# Patient Record
Sex: Male | Born: 1950 | Race: White | Hispanic: No | Marital: Single | Smoking: Current every day smoker
Health system: Southern US, Community
[De-identification: ages and names within clinical notes are randomized; demographics above are authoritative.]

## PROBLEM LIST (undated history)

## (undated) DIAGNOSIS — I2510 Atherosclerotic heart disease of native coronary artery without angina pectoris: Secondary | ICD-10-CM

## (undated) DIAGNOSIS — Z9861 Coronary angioplasty status: Secondary | ICD-10-CM

## (undated) DIAGNOSIS — Z87891 Personal history of nicotine dependence: Secondary | ICD-10-CM

## (undated) DIAGNOSIS — I679 Cerebrovascular disease, unspecified: Secondary | ICD-10-CM

## (undated) DIAGNOSIS — K4090 Unilateral inguinal hernia, without obstruction or gangrene, not specified as recurrent: Secondary | ICD-10-CM

## (undated) DIAGNOSIS — I10 Essential (primary) hypertension: Secondary | ICD-10-CM

## (undated) DIAGNOSIS — K219 Gastro-esophageal reflux disease without esophagitis: Secondary | ICD-10-CM

## (undated) DIAGNOSIS — F1011 Alcohol abuse, in remission: Secondary | ICD-10-CM

## (undated) DIAGNOSIS — E785 Hyperlipidemia, unspecified: Secondary | ICD-10-CM

## (undated) DIAGNOSIS — R002 Palpitations: Secondary | ICD-10-CM

## (undated) DIAGNOSIS — I252 Old myocardial infarction: Secondary | ICD-10-CM

## (undated) DIAGNOSIS — R0989 Other specified symptoms and signs involving the circulatory and respiratory systems: Secondary | ICD-10-CM

## (undated) DIAGNOSIS — I739 Peripheral vascular disease, unspecified: Secondary | ICD-10-CM

## (undated) DIAGNOSIS — J449 Chronic obstructive pulmonary disease, unspecified: Secondary | ICD-10-CM

## (undated) HISTORY — DX: Coronary angioplasty status: Z98.61

## (undated) HISTORY — PX: FEMORAL-FEMORAL BYPASS GRAFT: SHX936

## (undated) HISTORY — DX: Alcohol abuse, in remission: F10.11

## (undated) HISTORY — DX: Palpitations: R00.2

## (undated) HISTORY — DX: Old myocardial infarction: I25.2

## (undated) HISTORY — DX: Peripheral vascular disease, unspecified: I73.9

## (undated) HISTORY — DX: Chronic obstructive pulmonary disease, unspecified: J44.9

## (undated) HISTORY — PX: CORONARY ANGIOPLASTY: SHX604

## (undated) HISTORY — DX: Other specified symptoms and signs involving the circulatory and respiratory systems: R09.89

## (undated) HISTORY — DX: Gastro-esophageal reflux disease without esophagitis: K21.9

## (undated) HISTORY — DX: Essential (primary) hypertension: I10

## (undated) HISTORY — DX: Hyperlipidemia, unspecified: E78.5

## (undated) HISTORY — DX: Personal history of nicotine dependence: Z87.891

## (undated) HISTORY — DX: Cerebrovascular disease, unspecified: I67.9

## (undated) HISTORY — DX: Atherosclerotic heart disease of native coronary artery without angina pectoris: I25.10

## (undated) HISTORY — DX: Unilateral inguinal hernia, without obstruction or gangrene, not specified as recurrent: K40.90

---

## 2003-04-01 ENCOUNTER — Emergency Department (HOSPITAL_COMMUNITY): Admission: EM | Admit: 2003-04-01 | Discharge: 2003-04-01 | Payer: Self-pay | Admitting: Emergency Medicine

## 2004-03-03 ENCOUNTER — Emergency Department (HOSPITAL_COMMUNITY): Admission: EM | Admit: 2004-03-03 | Discharge: 2004-03-03 | Payer: Self-pay | Admitting: Emergency Medicine

## 2005-03-15 ENCOUNTER — Emergency Department (HOSPITAL_COMMUNITY): Admission: EM | Admit: 2005-03-15 | Discharge: 2005-03-15 | Payer: Self-pay | Admitting: Emergency Medicine

## 2006-01-05 HISTORY — PX: INGUINAL HERNIA REPAIR: SUR1180

## 2006-02-01 ENCOUNTER — Emergency Department (HOSPITAL_COMMUNITY): Admission: EM | Admit: 2006-02-01 | Discharge: 2006-02-01 | Payer: Self-pay | Admitting: Emergency Medicine

## 2006-03-19 ENCOUNTER — Ambulatory Visit: Payer: Self-pay | Admitting: Cardiology

## 2006-03-24 ENCOUNTER — Ambulatory Visit: Payer: Self-pay | Admitting: Cardiology

## 2006-05-05 ENCOUNTER — Ambulatory Visit: Payer: Self-pay | Admitting: Cardiology

## 2006-05-12 ENCOUNTER — Ambulatory Visit: Payer: Self-pay | Admitting: Cardiology

## 2006-05-13 ENCOUNTER — Ambulatory Visit: Payer: Self-pay | Admitting: Cardiovascular Disease

## 2006-05-13 LAB — CONVERTED CEMR LAB
Eosinophils Absolute: 0.1 10*3/uL (ref 0.0–0.6)
Eosinophils Relative: 1.1 % (ref 0.0–5.0)
GFR calc Af Amer: 89 mL/min
GFR calc non Af Amer: 74 mL/min
Glucose, Bld: 103 mg/dL — ABNORMAL HIGH (ref 70–99)
HCT: 42.4 % (ref 39.0–52.0)
Lymphocytes Relative: 30 % (ref 12.0–46.0)
MCV: 102.7 fL — ABNORMAL HIGH (ref 78.0–100.0)
Neutro Abs: 4.4 10*3/uL (ref 1.4–7.7)
Neutrophils Relative %: 60.7 % (ref 43.0–77.0)
Platelets: 215 10*3/uL (ref 150–400)
Sodium: 136 meq/L (ref 135–145)
WBC: 7.3 10*3/uL (ref 4.5–10.5)

## 2006-05-18 ENCOUNTER — Ambulatory Visit: Payer: Self-pay | Admitting: Cardiovascular Disease

## 2006-05-18 ENCOUNTER — Ambulatory Visit (HOSPITAL_COMMUNITY): Admission: RE | Admit: 2006-05-18 | Discharge: 2006-05-18 | Payer: Self-pay | Admitting: Cardiovascular Disease

## 2006-05-27 ENCOUNTER — Encounter: Payer: Self-pay | Admitting: Cardiology

## 2006-07-05 ENCOUNTER — Ambulatory Visit: Payer: Self-pay

## 2006-07-12 ENCOUNTER — Ambulatory Visit: Payer: Self-pay | Admitting: Cardiovascular Disease

## 2006-07-12 LAB — CONVERTED CEMR LAB
BUN: 12 mg/dL (ref 6–23)
Calcium: 9.6 mg/dL (ref 8.4–10.5)
Eosinophils Absolute: 0.1 10*3/uL (ref 0.0–0.6)
GFR calc Af Amer: 113 mL/min
GFR calc non Af Amer: 93 mL/min
Glucose, Bld: 119 mg/dL — ABNORMAL HIGH (ref 70–99)
Lymphocytes Relative: 20.3 % (ref 12.0–46.0)
MCHC: 34.4 g/dL (ref 30.0–36.0)
MCV: 102.9 fL — ABNORMAL HIGH (ref 78.0–100.0)
Monocytes Relative: 7 % (ref 3.0–11.0)
Neutro Abs: 4.8 10*3/uL (ref 1.4–7.7)
Platelets: 258 10*3/uL (ref 150–400)

## 2006-07-16 ENCOUNTER — Ambulatory Visit (HOSPITAL_COMMUNITY): Admission: RE | Admit: 2006-07-16 | Discharge: 2006-07-16 | Payer: Self-pay | Admitting: Cardiovascular Disease

## 2006-07-22 ENCOUNTER — Ambulatory Visit: Payer: Self-pay | Admitting: Cardiovascular Disease

## 2008-09-25 DIAGNOSIS — I259 Chronic ischemic heart disease, unspecified: Secondary | ICD-10-CM

## 2008-09-25 DIAGNOSIS — Z8679 Personal history of other diseases of the circulatory system: Secondary | ICD-10-CM | POA: Insufficient documentation

## 2008-09-25 DIAGNOSIS — I10 Essential (primary) hypertension: Secondary | ICD-10-CM | POA: Insufficient documentation

## 2008-09-25 DIAGNOSIS — K219 Gastro-esophageal reflux disease without esophagitis: Secondary | ICD-10-CM

## 2008-09-25 DIAGNOSIS — I739 Peripheral vascular disease, unspecified: Secondary | ICD-10-CM

## 2008-09-25 DIAGNOSIS — Z87898 Personal history of other specified conditions: Secondary | ICD-10-CM

## 2008-09-25 DIAGNOSIS — Z9861 Coronary angioplasty status: Secondary | ICD-10-CM

## 2008-09-25 DIAGNOSIS — Z87891 Personal history of nicotine dependence: Secondary | ICD-10-CM

## 2008-09-25 DIAGNOSIS — E785 Hyperlipidemia, unspecified: Secondary | ICD-10-CM

## 2008-11-16 ENCOUNTER — Encounter: Payer: Self-pay | Admitting: Cardiology

## 2009-02-19 ENCOUNTER — Ambulatory Visit: Payer: Self-pay | Admitting: Cardiology

## 2009-02-19 ENCOUNTER — Encounter: Payer: Self-pay | Admitting: Cardiology

## 2009-02-20 ENCOUNTER — Encounter: Payer: Self-pay | Admitting: Cardiology

## 2009-02-21 ENCOUNTER — Encounter: Payer: Self-pay | Admitting: Cardiology

## 2009-02-26 ENCOUNTER — Ambulatory Visit: Payer: Self-pay | Admitting: Cardiology

## 2009-02-26 DIAGNOSIS — I7389 Other specified peripheral vascular diseases: Secondary | ICD-10-CM

## 2009-02-26 DIAGNOSIS — R9430 Abnormal result of cardiovascular function study, unspecified: Secondary | ICD-10-CM | POA: Insufficient documentation

## 2009-03-04 ENCOUNTER — Encounter: Payer: Self-pay | Admitting: Cardiology

## 2009-03-15 ENCOUNTER — Ambulatory Visit: Payer: Self-pay | Admitting: Cardiovascular Disease

## 2009-03-18 ENCOUNTER — Encounter: Payer: Self-pay | Admitting: Cardiovascular Disease

## 2009-03-20 ENCOUNTER — Ambulatory Visit: Payer: Self-pay | Admitting: Cardiovascular Disease

## 2009-03-20 ENCOUNTER — Ambulatory Visit (HOSPITAL_COMMUNITY): Admission: RE | Admit: 2009-03-20 | Discharge: 2009-03-20 | Payer: Self-pay | Admitting: Cardiovascular Disease

## 2009-03-25 ENCOUNTER — Encounter: Payer: Self-pay | Admitting: Cardiology

## 2009-03-29 ENCOUNTER — Ambulatory Visit: Payer: Self-pay | Admitting: Vascular Surgery

## 2009-04-03 ENCOUNTER — Encounter: Payer: Self-pay | Admitting: Cardiology

## 2009-04-04 ENCOUNTER — Inpatient Hospital Stay (HOSPITAL_COMMUNITY): Admission: RE | Admit: 2009-04-04 | Discharge: 2009-04-06 | Payer: Self-pay | Admitting: Vascular Surgery

## 2009-04-04 ENCOUNTER — Encounter: Payer: Self-pay | Admitting: Cardiology

## 2009-04-04 ENCOUNTER — Ambulatory Visit: Payer: Self-pay | Admitting: Vascular Surgery

## 2009-04-05 ENCOUNTER — Encounter: Payer: Self-pay | Admitting: Cardiology

## 2009-04-05 ENCOUNTER — Encounter: Payer: Self-pay | Admitting: Vascular Surgery

## 2009-04-06 ENCOUNTER — Encounter: Payer: Self-pay | Admitting: Cardiology

## 2009-04-25 ENCOUNTER — Encounter: Payer: Self-pay | Admitting: Cardiology

## 2009-07-19 ENCOUNTER — Telehealth (INDEPENDENT_AMBULATORY_CARE_PROVIDER_SITE_OTHER): Payer: Self-pay | Admitting: *Deleted

## 2009-08-14 ENCOUNTER — Encounter: Payer: Self-pay | Admitting: Cardiology

## 2009-08-28 ENCOUNTER — Ambulatory Visit: Payer: Self-pay | Admitting: Cardiology

## 2009-11-13 ENCOUNTER — Encounter: Payer: Self-pay | Admitting: Cardiology

## 2009-12-16 ENCOUNTER — Encounter: Payer: Self-pay | Admitting: Cardiology

## 2010-01-15 ENCOUNTER — Ambulatory Visit: Admit: 2010-01-15 | Payer: Self-pay | Admitting: Vascular Surgery

## 2010-01-23 ENCOUNTER — Ambulatory Visit: Admit: 2010-01-23 | Payer: Self-pay | Admitting: Vascular Surgery

## 2010-02-06 NOTE — Assessment & Plan Note (Signed)
Summary: eph-possible cath needed per degent   Visit Type:  hospital follow-up Primary Provider:  Dr. Sharlot Gowda Napier,MD  CC:  discuss abnormal stress test.  History of Present Illness: the patient is a 60 year old male with a history of coronary artery disease, severe peripheral vascular disease and nonobstructive cerebrovascular disease. The patient was recently hospitalized with atypical chest pain. A nuclear perfusion study was performed. Ejection fraction was 57%. There was a small reversible apical to mid anterior defect associated with normal wall motion. There appears some ischemia in this area. Overall the study was a low risk study. The patient presents now for followup. He reports no recurrent substernal chest pain. He does have dyspnea on exertion but it is hard to quantify due to his severe peripheral vascular disease. The patient has significant claudication, developing pain in the lower extremities walking less than 50 feet particularly in the right leg. He even reports that during the nighttime he has pain in his right leg when he lies on his right side. He is very uncomfortable and has to move side to side he only sleeps 3-4 hours a night.  The patient has known severe peripheral vascular disease and is status post stent placement in both iliac arteries. He has a known occlusion of the right iliac stent. The patient was supposed to follow up with Dr. Excell Seltzer in 2008 after his angiogram was performed., but failed to do so due to lack of insurance. recommendation was given for the patient to proceed with surgery.  From a cardiac standpoint he is stable. As outlined above he has no recurrent chest pain. He has not used any nitroglycerin. His blood pressure however is poorly controlled.  Clinical Review Panels:  Nuclear Stress Testing Nuclear Stress Test Findings abnormal LV perfusion. Stress testing induced no chest pain symptoms and no EKG changes consistent with ischemia. This was a  LexiScan there was increased t.i.d. ratio. Ejection fraction was 57%. There was a small completely reversible apical to mid anterior defect associated with normal wall motion. This defect is consistent with ischemia. There was also second medium fixed mid to basal inferior defect associated with normal wall motion this defect is consistent with attenuation artifact (02/21/2009)  Vascular Studies Arterial Doppler  Arterial Doppler study from July 05, 2006 demonstrated no flow within the right external iliac stent. The common femoral artery fills from a collateral, as well as from retrograde flow from the profunda. The anterior tibial and posterior tibial wave forms on the right arm monophasic. On the left side, the anterior tibial and posterior tibial wave forms are brisk and biphasic.  (07/12/2006)  Cardiac Imaging Cardiac Cath Findings  FINDINGS:  The distal aorta is angiographically normal.  The left iliac   artery, the left common and external iliac arteries are angiographically   normal.  The left common femoral artery tapers into a smaller vessel,   but there is no focal stenosis.      On the right, the common iliac artery is normal.  The right hypogastric   artery is severely diseased, but is patent.  Just at the origin of the   stent in the external iliac artery there is a flush occlusion.  The   entire 6 cm of stented segment are occluded and flow reconstitutes in   the common femoral artery via collaterals from the hypogastric.  There   are minor luminal irregularities in the SFA throughout.  The popliteal   has minor luminal irregularities.  There is three-vessel  runoff to the   foot without any significant stenoses.  There is slow flow in   infragenicular vessels.  The right profunda femoris is patent.      ASSESSMENT:   1. 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   2. Three-vessel runoff to the foot.      I reviewed the  films and I am not very hopeful of long-term patency via   an endovascular approach.  In addition, the occlusion appears to extend   down into the common femoral artery, which limits endovascular options.   I reviewed   the study with Dr. Arbie Cookey, who agreed that surgical treatment would   provide the best long-term result.  He would likely be a good candidate   for a left-to-right fem-fem bypass.  We will make arrangements for Mr.   Trudel to see Dr. Arbie Cookey in the office.  I will discontinue his Plavix and   continue him on aspirin alone.               Veverly Fells. Excell Seltzer, MD   Electronically Signed  (07/16/2006)    Preventive Screening-Counseling & Management  Alcohol-Tobacco     Smoking Status: current     Smoking Cessation Counseling: yes     Packs/Day: 1/2 PPD  Current Problems (verified): 1)  Percutaneous Transluminal Coronary Angioplasty, Hx of  (ICD-V45.82) 2)  Right Inguinal Hernia Repair  () 3)  Coronary Artery Disease, S/p Ptca  (ICD-414.9) 4)  Palpitations, Hx of  (ICD-V12.50) 5)  Gastroesophageal Reflux Disease  (ICD-530.81) 6)  Inguinal Hernia, Right, Hx of  (ICD-V13.8) 7)  Dyslipidemia  (ICD-272.4) 8)  Tobacco Abuse, Hx of  (ICD-V15.82) 9)  Hypertension  (ICD-401.9) 10)  Myocardial Infarction, Hx of  (ICD-410.90) 11)  Intermittent Claudication, Right Leg  (ICD-443.9)  Current Medications (verified): 1)  Aspir-Low 81 Mg Tbec (Aspirin) .... Take 1 Tablet By Mouth Once A Day 2)  Combivent 18-103 Mcg/act Aero (Ipratropium-Albuterol) .... Inhal Two Puffs Four Times A Day 3)  Nitrostat 0.4 Mg Subl (Nitroglycerin) .... As Needed 4)  Amlodipine Besylate 10 Mg Tabs (Amlodipine Besylate) .... Take 1 Tablet By Mouth Once A Day (Place On File) 5)  Isosorbide Mononitrate Cr 30 Mg Xr24h-Tab (Isosorbide Mononitrate) .... Take 1 Tablet By Mouth Once A Day 6)  Avelox 400 Mg Tabs (Moxifloxacin Hcl) .... Take 1 Tablet By Mouth Once A Day 7)  Simvastatin 20 Mg Tabs (Simvastatin) ....  Take 1 Tab By Mouth At Bedtime  Allergies (verified): No Known Drug Allergies  Comments:  Nurse/Medical Assistant: The patient's medications and allergies were reviewed with the patient and were updated in the Medication and Allergy Lists. Bottles reviewed.  Past History:  Past Medical History: Last updated: 02/26/2009 Current Problems:  CORONARY ARTERY DISEASE, S/P PTCA (ICD-414.9) PALPITATIONS, HX OF (ICD-V12.50) GASTROESOPHAGEAL REFLUX DISEASE INGUINAL HERNIA, RIGHT, HX OF (ICD-V13.8) DYSLIPIDEMIA (ICD-272.4) TOBACCO ABUSE, HX OF (ICD-V15.82) HYPERTENSION (ICD-401.9) MYOCARDIAL INFARCTION, HX OF (ICD-410.90) age 8 again age 75 INTERMITTENT CLAUDICATION, RIGHT LEG (ICD-443.9)  1. Coronary artery disease.     a.     History of myocardial infarction.     b.     Status post percutaneous coronary intervention in the past,      details unavailable. 2. Uncontrolled hypertension. 3. Dyslipidemia, untreated. 4. Peripheral arterial disease with abnormal ABIs (0.58 on the right,     0.93 on the left). 5. Cerebral vascular  disease with less than 50% bilateral internal     carotid artery stenosis by recent carotid Dopplers. 6. Abdominal bruit. 7. Recent history of right inguinal hernia repair. 8. Gastroesophageal reflux disease. 9. Palpitations. 10.Ongoing tobacco abuse. 11.History of alcohol abuse.   Past Surgical History: Last updated: 09/25/2008 Current Problems:  PERCUTANEOUS TRANSLUMINAL CORONARY ANGIOPLASTY, HX OF (ICD-V45.82) times 3 * RIGHT INGUINAL HERNIA REPAIR 2008  Family History: Last updated: 09/25/2008 Family History of Cancer:  no family history of peripheral artery disease.  Social History: Last updated: 09/25/2008 Divorced 3 children Tobacco Use - Yes.  Alcohol Use - yes  Risk Factors: Smoking Status: current (02/26/2009) Packs/Day: 1/2 PPD (02/26/2009)  Social History: Packs/Day:  1/2 PPD  Review of Systems       The patient complains of  fatigue and shortness of breath.  The patient denies malaise, fever, weight gain/loss, vision loss, decreased hearing, hoarseness, chest pain, palpitations, prolonged cough, wheezing, sleep apnea, coughing up blood, abdominal pain, blood in stool, nausea, vomiting, diarrhea, heartburn, incontinence, blood in urine, muscle weakness, joint pain, leg swelling, rash, skin lesions, headache, fainting, dizziness, depression, anxiety, enlarged lymph nodes, easy bruising or bleeding, and environmental allergies.         claudication  Vital Signs:  Patient profile:   60 year old male Height:      68 inches Weight:      149 pounds BMI:     22.74 Pulse rate:   81 / minute BP sitting:   165 / 79  (left arm) Cuff size:   regular  Vitals Entered By: Carlye Grippe (February 26, 2009 1:02 PM)  Serial Vital Signs/Assessments:  Time      Position  BP       Pulse  Resp  Temp     By 1:54 PM             160/85   73                    Hoover Brunette, LPN  CC: discuss abnormal stress test   Physical Exam  Additional Exam:  General: Well-developed, well-nourished in no distress head: Normocephalic and atraumatic eyes PERRLA/EOMI intact, conjunctiva and lids normal nose: No deformity or lesions mouth normal dentition, normal posterior pharynx neck: Supple, no JVD.  No masses, thyromegaly or abnormal cervical nodes lungs: Normal breath sounds bilaterally with faint wheezes..  Normal percussion heart: regular rate and rhythm with normal S1 and S2, no S3 or S4.  PMI is normal.  No pathological murmurs abdomen: Normal bowel sounds, abdomen is soft and nontender without masses, organomegaly or hernias noted.  No hepatosplenomegaly musculoskeletal: Back normal, normal gait muscle strength and tone normal pulsus:unable to palpate pulses in the right lower extremity both her son is present posterior tibial. Faint palpable pulses in the left her cells present posterior tibial. Extremities: No peripheral pitting  edema neurologic: Alert and oriented x 3 skin: Intact without lesions or rashes cervical nodes: No significant adenopathy psychologic: Normal affect    Impression & Recommendations:  Problem # 1:  NONSPECIFIC ABNORMAL UNSPEC CV FUNCTION STUDY (ICD-794.30) the patient was recently hospitalized with atypical chest pain. The patient now reports no recurrent pain. He was ruled out for myocardial infarction. Stress Cardiolite studies showed a small area of anterior ischemia. However is felt at this point the patient can be treated medically.  Problem # 2:  PVD WITH CLAUDICATION (ICD-443.89) the patient is status post prior PTCA stent placement of both  iliac arteries. He has a known 100% occlusion of the right iliac artery after an angiogram in 2008. The patient now has resting pain in the right leg as well as severe claudication particularly in the right leg but also in the left leg on minimal exertion. Previously was not felt that an intervention could be done percutaneously. Dr. Excell Seltzer referred patient to Dr. Karlene Lineman for surgery, however the patient had no insurance at the time and did not proceed with the appointment. He is now willing to proceed. Has obtained Medicaid. I will refer to patient first to Dr. Excell Seltzer given the fact that he has claudication in the left lower extremity and may need a repeat angiogram prior to proceeding with surgery.  Problem # 3:  CORONARY ARTERY DISEASE, S/P PTCA (ICD-414.9) the patient does not recall a prior coronary intervention. I could not find any records regarding this. We will continue with aggressive risk factor modification. The patient continues to smoke and I counseled him extensively. I also placed him on statin drug therapy with simvastatin 20 milligramp.o. q.h.s. The following medications were removed from the medication list:    Bisoprolol-hydrochlorothiazide 5-6.25 Mg Tabs (Bisoprolol-hydrochlorothiazide) .Marland Kitchen... Take 1 tablet by mouth once a day     Lisinopril 40 Mg Tabs (Lisinopril) .Marland Kitchen... Take 1 tablet by mouth once a day    Plavix 75 Mg Tabs (Clopidogrel bisulfate) .Marland Kitchen... Take 1 tablet by mouth once a day His updated medication list for this problem includes:    Aspir-low 81 Mg Tbec (Aspirin) .Marland Kitchen... Take 1 tablet by mouth once a day    Nitrostat 0.4 Mg Subl (Nitroglycerin) .Marland Kitchen... As needed    Amlodipine Besylate 10 Mg Tabs (Amlodipine besylate) .Marland Kitchen... Take 1 tablet by mouth once a day (place on file)    Isosorbide Mononitrate Cr 30 Mg Xr24h-tab (Isosorbide mononitrate) .Marland Kitchen... Take 1 tablet by mouth once a day  Problem # 4:  HYPERTENSION (ICD-401.9) the patient blood pressure is poorly controlled and I increased amlodipine to 10 mg p.o. q. daily. The following medications were removed from the medication list:    Bisoprolol-hydrochlorothiazide 5-6.25 Mg Tabs (Bisoprolol-hydrochlorothiazide) .Marland Kitchen... Take 1 tablet by mouth once a day    Lisinopril 40 Mg Tabs (Lisinopril) .Marland Kitchen... Take 1 tablet by mouth once a day His updated medication list for this problem includes:    Aspir-low 81 Mg Tbec (Aspirin) .Marland Kitchen... Take 1 tablet by mouth once a day    Amlodipine Besylate 10 Mg Tabs (Amlodipine besylate) .Marland Kitchen... Take 1 tablet by mouth once a day (place on file)  Patient Instructions: 1)  Increase Amlodipine to 10mg  daily 2)  Referral to Dr. Excell Seltzer for claudication 3)  Simvastatin 20mg  at bedtime  4)  Labs with Dr. Barbara Cower soon - CMET, Lipids, LFT's  (please send copy) 5)  Follow up in  6 months.    Prescriptions: SIMVASTATIN 20 MG TABS (SIMVASTATIN) Take 1 tab by mouth at bedtime  #30 x 6   Entered by:   Hoover Brunette, LPN   Authorized by:   Lewayne Bunting, MD, [facility]   Signed by:   Hoover Brunette, LPN on 16/10/9602   Method used:   Electronically to        [facility]* (retail)       [address]       [place], [place]  [address]       Ph: [phone]       Fax: [phone]  RxID:   [id number] AMLODIPINE BESYLATE 10 MG  TABS (AMLODIPINE BESYLATE) Take 1 tablet by mouth once a day (place on file)  #30 x 6   Entered by:   Hoover Brunette, LPN   Authorized by:   Lewayne Bunting, MD, [facility]   Signed by:   Hoover Brunette, LPN on 24/40/1027   Method used:   Electronically to        [facility] (retail)       [address] [place], [place]  [address]       Ph: [phone]       Fax: [phone]   RxID:   [id number]

## 2010-02-06 NOTE — Assessment & Plan Note (Signed)
Summary: ec6   Visit Type:  Initial Consult Primary Provider:  Dr. Sharlot Gowda Napier,MD  CC:  New PV evaluation per Dr. Andee Lineman- Claudication.  History of Present Illness: the patient is a 60 year old male with a history of coronary artery disease, severe peripheral vascular disease and nonobstructive cerebrovascular disease. He underwent right iliac stenting in 2008 for treatment of severe intermittent claudication. He followed up with recurrent leg pain and was found to have iliac stent occlusion on repeat angiography. I referred him to Dr Early for consideration of fem-fem bypass, but he was lost to follow-up because of limited resources.  He was recently seen by Dr Andee Lineman and referred back for follow-up evaluation. He reports progressive right leg claudication symptoms and rest pain. Pain is in hip, buttock, and is worst in the right calf. He also has left calf pain with ambulation, but not as bad as the right. No ulceration reported. He also c/o numbness and tingling of both feet, worse on the right.   The pt was recently hospitalized for chest pain. He underwent a Myoview scan showing anteroapical ischemia, but the defect was small so he was managed medically. He has known CAD with prior diagonal branch PTCA in 1993. Cath at that time showed moderate nonobstructive disease of the LCx as well.  Current Medications (verified): 1)  Aspir-Low 81 Mg Tbec (Aspirin) .... Take 1 Tablet By Mouth Once A Day 2)  Combivent 18-103 Mcg/act Aero (Ipratropium-Albuterol) .... Inhal Two Puffs Four Times A Day 3)  Nitrostat 0.4 Mg Subl (Nitroglycerin) .... As Needed 4)  Amlodipine Besylate 10 Mg Tabs (Amlodipine Besylate) .... Take 1 Tablet By Mouth Once A Day (Place On File) 5)  Isosorbide Mononitrate Cr 30 Mg Xr24h-Tab (Isosorbide Mononitrate) .... Take 1 Tablet By Mouth Once A Day 6)  Simvastatin 20 Mg Tabs (Simvastatin) .... Take 1 Tab By Mouth At Bedtime 7)  Lisinopril 20 Mg Tabs (Lisinopril) .... Take One  Tablet By Mouth Daily  Allergies (verified): No Known Drug Allergies  Past History:  Past medical history reviewed for relevance to current acute and chronic problems.  Past Medical History: Reviewed history from 02/26/2009 and no changes required. Current Problems:  CORONARY ARTERY DISEASE, S/P PTCA (ICD-414.9) PALPITATIONS, HX OF (ICD-V12.50) GASTROESOPHAGEAL REFLUX DISEASE INGUINAL HERNIA, RIGHT, HX OF (ICD-V13.8) DYSLIPIDEMIA (ICD-272.4) TOBACCO ABUSE, HX OF (ICD-V15.82) HYPERTENSION (ICD-401.9) MYOCARDIAL INFARCTION, HX OF (ICD-410.90) age 27 again age 23 INTERMITTENT CLAUDICATION, RIGHT LEG (ICD-443.9)  1. Coronary artery disease.     a.     History of myocardial infarction.     b.     Status post percutaneous coronary intervention in the past,      details unavailable. 2. Uncontrolled hypertension. 3. Dyslipidemia, untreated. 4. Peripheral arterial disease with abnormal ABIs (0.58 on the right,     0.93 on the left). 5. Cerebral vascular disease with less than 50% bilateral internal     carotid artery stenosis by recent carotid Dopplers. 6. Abdominal bruit. 7. Recent history of right inguinal hernia repair. 8. Gastroesophageal reflux disease. 9. Palpitations. 10.Ongoing tobacco abuse. 11.History of alcohol abuse.   Review of Systems       Positive for exertional dyspnea and cough, otherwise negative except as per HPI.  Vital Signs:  Patient profile:   60 year old male Height:      68 inches Weight:      147.75 pounds BMI:     22.55 Pulse rate:   78 / minute Pulse rhythm:   regular Resp:  18 per minute BP sitting:   162 / 76  (left arm) Cuff size:   large  Vitals Entered By: Vikki Ports (March 15, 2009 4:06 PM)  Serial Vital Signs/Assessments:  Time      Position  BP       Pulse  Resp  Temp     By           R Arm     153/76                         Vikki Ports   Physical Exam  General:  Pt is alert and oriented, thin male, in no acute  distress. HEENT: normal Neck: normal carotid upstrokes with bilateral bruits, JVP normal Lungs: CTA CV: RRR with 2/6 systolic ejection murmur at LSB Abd: soft, NT, positive BS, no bruit, no organomegaly Ext: trace pedal edema bilaterally. Right leg pulses absent. Left femoral 2+, but pedals absent. Skin: dependent rubor right worse than left, warm feet.    Impression & Recommendations:  Problem # 1:  PVD WITH CLAUDICATION (ICD-443.89) The patient has progressive claudication symptoms and can only walk short distances. He has prolonged pain after a very short walking distance as well as nocturnal leg pain. He is developing chronic ischemic symptoms and I have recommended repeat angiography to define his revascularization options. Risks, indications, and alternatives were reviewed in detail and he agrees to proceed. He likely will require surgical revascularization since his external iliac is occluded into his common femoral artery. He should continue wiht antiplatelet Rx and risk reduction measures as he is doing. The importance of tobacco cessation was reviewed with him.  Problem # 2:  CORONARY ARTERY DISEASE, S/P PTCA (ICD-414.9) With recent episode of chest pain, abnormal Myoview, and known CAD, I would favor cardiac cath at the time of his lower extremity angiogram. He likely will require major vascular surgery and we need to make sure that he doesn't have critical CAD. I reviewed his prior cath report and he had moderate CAD nearly 20 years ago, so the likelihood of severe CAD is very high.  His updated medication list for this problem includes:    Aspir-low 81 Mg Tbec (Aspirin) .Marland Kitchen... Take 1 tablet by mouth once a day    Nitrostat 0.4 Mg Subl (Nitroglycerin) .Marland Kitchen... As needed    Amlodipine Besylate 10 Mg Tabs (Amlodipine besylate) .Marland Kitchen... Take 1 tablet by mouth once a day (place on file)    Isosorbide Mononitrate Cr 30 Mg Xr24h-tab (Isosorbide mononitrate) .Marland Kitchen... Take 1 tablet by mouth once a  day    Lisinopril 20 Mg Tabs (Lisinopril) .Marland Kitchen... Take one tablet by mouth daily  Orders: Cardiac Catheterization (Cardiac Cath) PV Procedure (PV Procedure)  Patient Instructions: 1)  Your physician has requested that you have a cardiac catheterization.  Cardiac catheterization is used to diagnose and/or treat various heart conditions. Doctors may recommend this procedure for a number of different reasons. The most common reason is to evaluate chest pain. Chest pain can be a symptom of coronary artery disease (CAD), and cardiac catheterization can show whether plaque is narrowing or blocking your heart's arteries. This procedure is also used to evaluate the valves, as well as measure the blood flow and oxygen levels in different parts of your heart.  For further information please visit [email or web address]  Please follow instruction sheet, as given. 2)  Your physician has requested that you have a peripheral vascular angiogram. This exam is performed  at the hospital. During this exam IV contrast is used to look at arterial blood flow.  Please review the information sheet given for details.

## 2010-02-06 NOTE — Letter (Signed)
Summary: [facility] D/C DR. Wende Crease  [facility] D/C DR. Wende Crease   Imported By: Zachary George 02/26/2009 10:07:26  _____________________________________________________________________  External Attachment:    Type:   Image     Comment:   External Document

## 2010-02-06 NOTE — Letter (Signed)
Summary: Cardiac Catheterization Instructions- Main Lab  [facility], Main Office  [address]   [place], [place] [address]   Phone: [phone]  Fax: [phone]     03/15/2009 MRN: [id number]  Ralph [name] [address] [place], [place]  [address]  Dear Mr. Ralph [name],   You are scheduled for Cardiac Catheterization and Peripheral Vascular Angiogram on Wednesday March 20, 2009 with Dr. Excell Seltzer.  Please arrive at the [facility] at 7:00      a.m. on the day of your procedure.  1. DIET     _X___ Nothing to eat or drink after midnight except your medications with a sip of water.  2. MAKE SURE YOU TAKE YOUR ASPIRIN.  3. _X___ YOU MAY TAKE ALL of your remaining medications with a small amount of water.       4. Plan for one night stay - bring personal belongings (i.e. toothpaste, toothbrush, etc.)  5. Bring a current list of your medications and current insurance cards.  6. Must have a responsible person to drive you home.   7. Someone must be with you for the first 24 hours after you arrive home.  8. Please wear clothes that are easy to get on and off and wear slip-on shoes.  *Special note: Every effort is made to have your procedure done on time.  Occasionally there are emergencies that present themselves at the hospital that may cause delays.  Please be patient if a delay does occur.  If you have any questions after you get home, please call the office at the number listed above.  Julieta Gutting, RN, BSN

## 2010-02-06 NOTE — Progress Notes (Signed)
Summary: chest pain      Phone Note Call from Patient   Summary of Call: c/o chest pains off/on x last 3-4 months.  Occurs mostly with exertion.  SOB seems to be more now.  States that pain has not been bad enough to go to ED for evaluation.  Does have OV in August with you.  Advised to go to ED if symptoms worsen.  Hoover Brunette, LPN  July 19, 2009 11:48 AM   Follow-up for Phone Call        Agree Follow-up by: Lewayne Bunting, MD, [facility],  July 19, 2009 1:10 PM  Additional Follow-up for Phone Call Additional follow up Details #1::        Left messaage with relative at home.  States he was not there now.  Advised him to let him know to go to ED for evaluation if chest pain worsens over weekend.  He can return call on Monday.  Hoover Brunette, LPN  July 19, 2009 5:02 PM

## 2010-02-06 NOTE — Assessment & Plan Note (Signed)
Summary: 6 MO FU PER AUG REMINDER-SRS   Visit Type:  Follow-up Primary Provider:  Dr. Sharlot Gowda Napier,MD   History of Present Illness: the patient is a 60 year old male history of coronary artery disease status post prior percutaneous coronary intervention. The patient also has right carotid artery disease. The patient also has a history of peripheral vascular disease with right leg claudication with right external iliac artery occlusion. The patient underwent a left to right femoral-femoral bypass several months ago. He has been doing well. He is now able to walk longer distances and reports no pain in his lower extremities.  He reports no substernal chest pain. He has no shortness of breath. He has no orthopnea PND. He has no palpitations or syncope. His LDL is 59 milligram percent and at goal. The patient is doing quite well.  Preventive Screening-Counseling & Management  Alcohol-Tobacco     Smoking Status: current     Smoking Cessation Counseling: yes     Packs/Day: 1/2 PPD  Current Medications (verified): 1)  Aspir-Low 81 Mg Tbec (Aspirin) .... Take 1 Tablet By Mouth Once A Day 2)  Combivent 18-103 Mcg/act Aero (Ipratropium-Albuterol) .... Inhal Two Puffs Four Times A Day 3)  Nitrostat 0.4 Mg Subl (Nitroglycerin) .... As Needed 4)  Amlodipine Besylate 10 Mg Tabs (Amlodipine Besylate) .... Take 1 Tablet By Mouth Once A Day (Place On File) 5)  Isosorbide Mononitrate Cr 30 Mg Xr24h-Tab (Isosorbide Mononitrate) .... Take 1 Tablet By Mouth Once A Day 6)  Simvastatin 20 Mg Tabs (Simvastatin) .... Take 1 Tab By Mouth At Bedtime 7)  Lisinopril 20 Mg Tabs (Lisinopril) .... Take One Tablet By Mouth Daily 8)  Omeprazole 20 Mg Cpdr (Omeprazole) .... Take 1 Tablet By Mouth Once A Day  Allergies (verified): No Known Drug Allergies  Comments:  Nurse/Medical Assistant: The patient's medication bottles and allergies were reviewed with the patient and were updated in the Medication and Allergy  Lists.  Past History:  Past Medical History: Current Problems:  CORONARY ARTERY DISEASE, S/P PTCA (ICD-414.9) status post cardiac catheterization March 2011 moderate diffuse three-vessel coronary artery disease with normal LV function. 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 COPD  1. Coronary artery disease.     a.     History of myocardial infarction.     b.     Status post percutaneous coronary intervention in the past,      details unavailable. 2. Uncontrolled hypertension. 3. Dyslipidemia, untreated. 4. Peripheral arterial disease with abnormal ABIs (0.58 on the right,     0.93 on the left). 5. Cerebral vascular disease with less than 50% bilateral internal     carotid artery stenosis by recent carotid Dopplers. 6. Abdominal bruit. 7. Recent history of right inguinal hernia repair. 8. Gastroesophageal reflux disease. 9. Palpitations. 10.Ongoing tobacco abuse. 11.History of alcohol abuse.   Review of Systems  The patient denies fatigue, malaise, fever, weight gain/loss, vision loss, decreased hearing, hoarseness, chest pain, palpitations, shortness of breath, prolonged cough, wheezing, sleep apnea, coughing up blood, abdominal pain, blood in stool, nausea, vomiting, diarrhea, heartburn, incontinence, blood in urine, muscle weakness, joint pain, leg swelling, rash, skin lesions, headache, fainting, dizziness, depression, anxiety, enlarged lymph nodes, easy bruising or bleeding, and environmental allergies.  Vital Signs:  Patient profile:   60 year old male Height:      68 inches Weight:      142 pounds Pulse rate:    71 / minute BP sitting:   143 / 72  (left arm) Cuff size:   regular  Vitals Entered By: Carlye Grippe (August 28, 2009 10:44 AM)  Physical Exam  Additional Exam:  General: Well-developed, well-nourished in no distress head: Normocephalic and atraumatic eyes PERRLA/EOMI intact, conjunctiva and lids normal nose: No deformity or lesions mouth normal dentition, normal posterior pharynx neck: Supple, no JVD.  No masses, thyromegaly or abnormal cervical nodes lungs: Normal breath sounds bilaterally with faint wheezes..  Normal percussion heart: regular rate and rhythm with normal S1 and S2, no S3 or S4.  PMI is normal.  No pathological murmurs abdomen: Normal bowel sounds, abdomen is soft and nontender without masses, organomegaly or hernias noted.  No hepatosplenomegaly musculoskeletal: Back normal, normal gait muscle strength and tone normal pulsus:unable to palpate pulses in the right lower extremity both her son is present posterior tibial. Faint palpable pulses in the left her cells present posterior tibial. Extremities: No peripheral pitting edema neurologic: Alert and oriented x 3 skin: Intact without lesions or rashes cervical nodes: No significant adenopathy psychologic: Normal affect    Impression & Recommendations:  Problem # 1:  PERCUTANEOUS TRANSLUMINAL CORONARY ANGIOPLASTY, HX OF (ICD-V45.82) the patient has no recurrent chest pain. Continue medical therapy and risk factor modification. The patient has moderate diffuse three-vessel coronary artery disease and normal LV function by catheterization 3 2011. We will continue with medical therapy.  Problem # 2:  PVD WITH CLAUDICATION (ICD-443.89) the patient is status post left to right femoral femoral bypass surgery. He has significant improvement in his claudication symptoms.he is status post total occlusion of the right external iliac artery  Problem # 3:  TOBACCO ABUSE, HX OF (ICD-V15.82) the patient was counseled  regarding his tobacco use.  Problem # 4:  DYSLIPIDEMIA (ICD-272.4) patient is at goal with his lipid management. His updated medication list for this problem includes:    Simvastatin 20 Mg Tabs (Simvastatin) .Marland Kitchen... Take 1 tab by mouth at bedtime  Patient Instructions: 1)  Your physician recommends that you continue on your current medications as directed. Please refer to the Current Medication list given to you today. 2)  Follow up in  6 months

## 2010-02-06 NOTE — Consult Note (Signed)
Summary: CARDIOLOGY CONSULT/ [facility]  CARDIOLOGY CONSULT/ [facility]   Imported By: Zachary George 02/26/2009 10:06:12  _____________________________________________________________________  External Attachment:    Type:   Image     Comment:   External Document

## 2010-02-06 NOTE — Miscellaneous (Signed)
Summary: Orders Update - PV Referral  Clinical Lists Changes  Orders: Added new Referral order of Misc. Referral (Misc. Ref) - Signed

## 2010-02-07 NOTE — Letter (Signed)
Summary: Discharge Summary  Discharge Summary   Imported By: Dorise Hiss 08/27/2009 11:42:56  _____________________________________________________________________  External Attachment:    Type:   Image     Comment:   External Document

## 2010-02-14 ENCOUNTER — Encounter (INDEPENDENT_AMBULATORY_CARE_PROVIDER_SITE_OTHER): Payer: Medicaid Other

## 2010-02-14 DIAGNOSIS — I70219 Atherosclerosis of native arteries of extremities with intermittent claudication, unspecified extremity: Secondary | ICD-10-CM

## 2010-02-14 DIAGNOSIS — Z48812 Encounter for surgical aftercare following surgery on the circulatory system: Secondary | ICD-10-CM

## 2010-02-19 ENCOUNTER — Encounter: Payer: Self-pay | Admitting: Cardiology

## 2010-03-11 ENCOUNTER — Ambulatory Visit (INDEPENDENT_AMBULATORY_CARE_PROVIDER_SITE_OTHER): Payer: Medicaid Other | Admitting: Cardiology

## 2010-03-11 ENCOUNTER — Encounter: Payer: Self-pay | Admitting: Cardiology

## 2010-03-11 DIAGNOSIS — Z9861 Coronary angioplasty status: Secondary | ICD-10-CM

## 2010-03-11 DIAGNOSIS — I2510 Atherosclerotic heart disease of native coronary artery without angina pectoris: Secondary | ICD-10-CM

## 2010-03-25 NOTE — Assessment & Plan Note (Signed)
Summary: 6 mo fu per feb reminder/agh   Visit Type:  Follow-up Primary Provider:  Dr. Sharlot Gowda Napier,MD   History of Present Illness: Patient had recent blood work done by primary care physician.  Creatinine was within normal limits.  Liver function tests were within normal limits.  Total cholesterol is 129 HDL cholesterol 47 LDL cholesterol 60.  PSA was .4 the patient was last seen in August of 2011.  He has a history of coronary artery disease and is status post prior percutaneous coronary intervention.  The patient also has right carotid artery disease.  He has a history of peripheral vascular disease with right leg claudication with right external iliac artery occlusion.  The patient is status-post left to right fem-fem bypass surgery last year. The patient reports to me that he was recently seen by vascular surgery.  He had arterial Doppler done two weeks ago .  I do not have those results results available, but apparently no further intervention is required. The patient was initially started by his primary care physician on hydrochlorothiazide for hypertension his blood pressure is well controlled now.  He denies any chest pain shortness of breath orthopnea or PND.  Unfortunately continues to smoke. His electrocardiogram in the office today was within normal limits.  Heart rate was 64 beats/min He does report occasional tightness in his legs and some claudication in the right leg significantly improved compared to his symptoms prior to bypass surgery.   Preventive Screening-Counseling & Management  Alcohol-Tobacco     Smoking Status: current     Smoking Cessation Counseling: yes     Packs/Day: 1/2 PPD  Current Medications (verified): 1)  Aspir-Low 81 Mg Tbec (Aspirin) .... Take 1 Tablet By Mouth Once A Day 2)  Combivent 18-103 Mcg/act Aero (Ipratropium-Albuterol) .... Inhal Two Puffs Four Times A Day 3)  Nitrostat 0.4 Mg Subl (Nitroglycerin) .... As Needed 4)  Amlodipine Besylate 10 Mg  Tabs (Amlodipine Besylate) .... Take 1 Tablet By Mouth Once A Day (Place On File) 5)  Isosorbide Mononitrate Cr 30 Mg Xr24h-Tab (Isosorbide Mononitrate) .... Take 1 Tablet By Mouth Once A Day 6)  Simvastatin 20 Mg Tabs (Simvastatin) .... Take 1 Tab By Mouth At Bedtime 7)  Lisinopril 20 Mg Tabs (Lisinopril) .... Take One Tablet By Mouth Daily 8)  Omeprazole 20 Mg Cpdr (Omeprazole) .... Take 1 Tablet By Mouth Once A Day 9)  Hydrochlorothiazide 12.5 Mg Caps (Hydrochlorothiazide) .... Take 1 Tablet By Mouth Once A Day 10)  Omeprazole 20 Mg Cpdr (Omeprazole) .... Take 1 Tablet By Mouth Once A Day  Allergies (verified): No Known Drug Allergies  Comments:  Nurse/Medical Assistant: The patient's medication bottles and allergies were reviewed with the patient and were updated in the Medication and Allergy Lists.  Past History:  Past Medical History: Last updated: 08/28/2009 Current Problems:  CORONARY ARTERY DISEASE, S/P PTCA (ICD-414.9) status post cardiac catheterization March 2011 moderate diffuse three-vessel coronary artery disease with normal LV function. 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 COPD  1. Coronary artery disease.     a.     History of myocardial infarction.     b.     Status post percutaneous coronary intervention in the past,  details unavailable. 2. Uncontrolled hypertension. 3. Dyslipidemia, untreated. 4. Peripheral arterial disease with abnormal ABIs (0.58 on the right,     0.93 on the left). 5. Cerebral vascular disease with less than 50% bilateral internal     carotid artery stenosis by recent  carotid Dopplers. 6. Abdominal bruit. 7. Recent history of right inguinal hernia repair. 8. Gastroesophageal reflux disease. 9. Palpitations. 10.Ongoing tobacco abuse. 11.History of alcohol abuse.  Past Surgical History: Last updated: 09/25/2008 Current Problems:  PERCUTANEOUS TRANSLUMINAL CORONARY ANGIOPLASTY, HX OF (ICD-V45.82) times 3 * RIGHT INGUINAL HERNIA REPAIR 2008  Family History: Last updated: 09/25/2008 Family History of Cancer:  no family history of peripheral artery disease.  Social History: Last updated: 09/25/2008 Divorced 3 children Tobacco Use - Yes.  Alcohol Use - yes  Risk Factors: Smoking Status: current (03/11/2010) Packs/Day: 1/2 PPD (03/11/2010)  Review of Systems  The patient denies fatigue, malaise, fever, weight gain/loss, vision loss, decreased hearing, hoarseness, chest pain, palpitations, shortness of breath, prolonged cough, wheezing, sleep apnea, coughing up blood, abdominal pain, blood in stool, nausea, vomiting, diarrhea, heartburn, incontinence, blood in urine, muscle weakness, joint pain, leg swelling, rash, skin lesions, headache, fainting, dizziness, depression, anxiety, enlarged lymph nodes, easy bruising or bleeding, and environmental allergies.    Vital Signs:  Patient profile:   60 year old male Height:      68 inches Weight:      149 pounds BMI:     22.74 Pulse rate:   85 / minute BP sitting:   121 / 78  (left arm) Cuff size:   regular  Vitals Entered By: Carlye Grippe (March 11, 2010 10:45 AM)  Physical Exam  Additional Exam:  General: Well-developed, well-nourished in no distress head: Normocephalic and atraumatic eyes PERRLA/EOMI intact, conjunctiva and lids normal nose: No deformity or lesions mouth normal dentition, normal posterior pharynx neck: Supple, no JVD.  No masses, thyromegaly or abnormal cervical nodes lungs: Normal breath sounds bilaterally with faint wheezes..  Normal percussion heart: regular rate and  rhythm with normal S1 and S2, no S3 or S4.  PMI is normal.  No pathological murmurs abdomen: Normal bowel sounds, abdomen is soft and nontender without masses, organomegaly or hernias noted.  No hepatosplenomegaly musculoskeletal: Back normal, normal gait muscle strength and tone normal pulsus:unable to palpate pulses in the right lower extremity both her son is present posterior tibial. Faint palpable pulses in the left her cells present posterior tibial. Extremities: No peripheral pitting edema neurologic: Alert and oriented x 3 skin: Intact without lesions or rashes cervical nodes: No significant adenopathy psychologic: Normal affect    Impression & Recommendations:  Problem # 1:  PVD WITH CLAUDICATION (ICD-443.89) peripheral vascular disease: Followed by vascular surgery status-post right leg claudication with left to right fem-fem bypass surgery in 2010.  Previously significant improvement in claudication Carotid artery disease: Less than 50% stenosis bilaterally abdominal bruit: Abdominal ultrasound 2008 showed no abdominal aortic aneurysm.  No indication for repeat testing.  Problem # 2:  CORONARY ARTERY DISEASE, S/P PTCA (ICD-414.9) coronary artery disease: Status post coronary intervention.  Post catheterization March 2011 with moderate diffuse 3-vessel disease with normal LV function His updated medication list for this problem includes:    Aspir-low 81 Mg Tbec (Aspirin) .Marland Kitchen... Take 1 tablet by mouth once a day    Nitrostat 0.4 Mg Subl (Nitroglycerin) .Marland Kitchen... As needed    Amlodipine Besylate 10 Mg Tabs (Amlodipine besylate) .Marland Kitchen... Take 1 tablet by mouth once a day (place on file)  Isosorbide Mononitrate Cr 30 Mg Xr24h-tab (Isosorbide mononitrate) .Marland Kitchen... Take 1 tablet by mouth once a day    Lisinopril 20 Mg Tabs (Lisinopril) .Marland Kitchen... Take one tablet by mouth daily  Orders: EKG w/ Interpretation (93000)  Problem # 3:  HYPERTENSION (ICD-401.9) hypertension: Started on  hydrochlorothiazide with good blood pressure control His updated medication list for this problem includes:    Aspir-low 81 Mg Tbec (Aspirin) .Marland Kitchen... Take 1 tablet by mouth once a day    Amlodipine Besylate 10 Mg Tabs (Amlodipine besylate) .Marland Kitchen... Take 1 tablet by mouth once a day (place on file)    Lisinopril 20 Mg Tabs (Lisinopril) .Marland Kitchen... Take one tablet by mouth daily    Hydrochlorothiazide 12.5 Mg Caps (Hydrochlorothiazide) .Marland Kitchen... Take 1 tablet by mouth once a day  Orders: EKG w/ Interpretation (93000)  Problem # 4:  TOBACCO ABUSE, HX OF (ICD-V15.82) tobacco use: Still smokes 10 cigarettes a day.  He is working with his primary care physician in trying to quit.  Patient Instructions: 1)  Your physician recommends that you continue on your current medications as directed. Please refer to the Current Medication list given to you today. 2)  Follow up in  1 year

## 2010-03-26 LAB — BASIC METABOLIC PANEL
Calcium: 8.6 mg/dL (ref 8.4–10.5)
GFR calc Af Amer: >60 mL/min (ref >60)
GFR calc non Af Amer: >60 mL/min (ref >60)
Glucose, Bld: 128 mg/dL — ABNORMAL HIGH (ref 70–99)
Sodium: 133 mEq/L — ABNORMAL LOW (ref 135–145)

## 2010-03-26 LAB — CBC
Hemoglobin: 11.9 g/dL — ABNORMAL LOW (ref 13.0–17.0)
Platelets: 218 10*3/uL (ref 150–400)
RDW: 14.8 % (ref 11.5–15.5)

## 2010-03-31 LAB — BLOOD GAS, ARTERIAL
Bicarbonate: 22.5 mEq/L (ref 20.0–24.0)
O2 Saturation: 97.1 %
Patient temperature: 98.6
TCO2: 23.5 mmol/L (ref 0–100)
pH, Arterial: 7.447 (ref 7.350–7.450)

## 2010-03-31 LAB — APTT
aPTT: 28 seconds (ref 24–37)
aPTT: 29 seconds (ref 24–37)

## 2010-03-31 LAB — COMPREHENSIVE METABOLIC PANEL
Albumin: 3.5 g/dL (ref 3.5–5.2)
Alkaline Phosphatase: 56 U/L (ref 39–117)
BUN: 5 mg/dL — ABNORMAL LOW (ref 6–23)
Calcium: 9.6 mg/dL (ref 8.4–10.5)
Creatinine, Ser: 0.81 mg/dL (ref 0.4–1.5)
Glucose, Bld: 98 mg/dL (ref 70–99)
Potassium: 4.3 mEq/L (ref 3.5–5.1)
Total Protein: 6.5 g/dL (ref 6.0–8.3)

## 2010-03-31 LAB — PROTIME-INR
INR: 0.97 (ref 0.00–1.49)
Prothrombin Time: 12.8 seconds (ref 11.6–15.2)

## 2010-03-31 LAB — CBC
HCT: 39.7 % (ref 39.0–52.0)
HCT: 42.5 % (ref 39.0–52.0)
Hemoglobin: 11.9 g/dL — ABNORMAL LOW (ref 13.0–17.0)
Hemoglobin: 13.7 g/dL (ref 13.0–17.0)
Hemoglobin: 14.8 g/dL (ref 13.0–17.0)
MCHC: 34.5 g/dL (ref 30.0–36.0)
MCV: 104 fL — ABNORMAL HIGH (ref 78.0–100.0)
Platelets: 238 10*3/uL (ref 150–400)
RBC: 3.27 MIL/uL — ABNORMAL LOW (ref 4.22–5.81)
RBC: 4.11 MIL/uL — ABNORMAL LOW (ref 4.22–5.81)
RDW: 15.1 % (ref 11.5–15.5)
WBC: 11.2 10*3/uL — ABNORMAL HIGH (ref 4.0–10.5)
WBC: 7.6 10*3/uL (ref 4.0–10.5)

## 2010-03-31 LAB — URINALYSIS, ROUTINE W REFLEX MICROSCOPIC
Nitrite: NEGATIVE
Protein, ur: NEGATIVE mg/dL
Specific Gravity, Urine: 1.004 — ABNORMAL LOW (ref 1.005–1.030)
Urobilinogen, UA: 1 mg/dL (ref 0.0–1.0)

## 2010-03-31 LAB — BASIC METABOLIC PANEL
Calcium: 8.7 mg/dL (ref 8.4–10.5)
Chloride: 108 mEq/L (ref 96–112)
Creatinine, Ser: 0.81 mg/dL (ref 0.4–1.5)
GFR calc Af Amer: >60 mL/min (ref >60)
GFR calc Af Amer: >60 mL/min (ref >60)
GFR calc non Af Amer: >60 mL/min (ref >60)
Glucose, Bld: 114 mg/dL — ABNORMAL HIGH (ref 70–99)
Potassium: 5.2 mEq/L — ABNORMAL HIGH (ref 3.5–5.1)
Sodium: 134 mEq/L — ABNORMAL LOW (ref 135–145)
Sodium: 139 mEq/L (ref 135–145)

## 2010-03-31 LAB — MRSA PCR SCREENING: MRSA by PCR: NEGATIVE

## 2010-03-31 LAB — ABO/RH: ABO/RH(D): O POS

## 2010-03-31 LAB — TYPE AND SCREEN: ABO/RH(D): O POS

## 2010-03-31 LAB — URINE MICROSCOPIC-ADD ON

## 2010-05-15 ENCOUNTER — Other Ambulatory Visit: Payer: Self-pay | Admitting: *Deleted

## 2010-05-15 MED ORDER — SIMVASTATIN 20 MG PO TABS: 20.0000 mg | ORAL_TABLET | Freq: Every evening | ORAL | Status: AC

## 2010-05-15 MED ORDER — AMLODIPINE BESYLATE 10 MG PO TABS: 10.0000 mg | ORAL_TABLET | Freq: Every day | ORAL | Status: AC

## 2010-05-20 NOTE — Assessment & Plan Note (Signed)
[facility]                          [facility] OFF[facility] NOTE   NAME:Ralph [name], Ralph [name]                          MRN:          [id number]  DATE:05/12/2006                            DOB:          1950-09-14    DATE OF BIRTH:  01/10/1950.   CARDIOLOGIST:  Learta Codding, M.D.   PRIMARY CARE PHYSICIAN:  None.   HISTORY OF PRESENT ILLNESS:  Mr. Mangiaracina is a 60 year old male patient whom  I saw back on May 05, 2006.  He does have a history of coronary  disease and we are seeing him in followup.  He had recently undergone a  right inguinal hernia repair.  His blood pressure was uncontrolled.  We  adjusted his medications.  Will also set him up for a PV consultation in  [place] with Dr. Veverly Fells. Cooper.  He has abnormal ABIs as well as  intermittent claudication.   He was in the office today for a blood pressure check and noted to the  nurse that he has some pain in his right groin as well as a knot.  I was  asked to come and look at it.  He denies any fever or chills.  He denies  any difficulty with bowel movements.  There is no bleeding.  Otherwise  he has no complaints.  He does note that the knot in his groin is  somewhat tender and there is a fairly linear area of tenderness that  radiates up from that.  He notes some swelling in that area as well in a  thin linear pattern.   MEDICATIONS:  1. Lisinopril 20 mg q.d.  2. Bisoprolol/hydrochlorothiazide 5/6.25 mg daily.  3. Aspirin 81 mg daily.   ALLERGIES:  No known drug allergies.   PHYSICAL EXAMINATION:  GENERAL:  He is a well-developed and well-  nourished male.  VITAL SIGNS:  Blood pressure 162/85, pulse 66.  HEENT:  Unremarkable.  ABDOMEN/EXTREMITIES:  Soft, nontender.  Right groin:  He does have a  right inguinal herniorrhaphy scar noted.  There is some mild soft tissue  swelling surrounding this.  There is one small area about the size of a  pea, maybe smaller that is noted.  This  feels as though it may be a  retained stitch from his recent surgery.  There is also a linear area of  soft tissue swelling radiating up his right flank.  This feels  consistent with some soft tissue changes, probably from his surgery.  There is a chance he may have a superficial thrombophlebitis, but there  is no gross tenderness or erythema noted.   IMPRESSION:  1. Hypertension.  2. Right groin pain.   PLAN:  Will increase the patient's Lisinopril to 40 mg q.d. and get a  BMET in one week.  I think his right groin and right flank symptoms are  probably secondary to post-surgical changes.  I have asked him to see  his surgeon for followup on this and further workup as necessary.  I  have reassured him about the findings on exam  today.  He can use warm  compresses and OTC analgesia as needed.      Tereso Newcomer, PA-C  Electronically Signed      Learta Codding, MD,FACC  Electronically Signed   SW/MedQ  DD: 05/12/2006  DT: 05/12/2006  Job #: [id number]

## 2010-05-20 NOTE — Consult Note (Signed)
NEW PATIENT CONSULTATION   NASIRE, REALI A  DOB:  1950-12-12                                       03/29/2009  VZDGL#:[id number]   The patient presents today for evaluation of right leg claudication  symptoms.  The patient is a 60 year old gentleman with a long history of  coronary artery disease, peripheral vascular occlusive disease, and  extra-cranial cerebrovascular occlusive disease.  He was recently  admitted with atypical chest pain.  At that time, he underwent cardiac  catheterization which showed an ejection fraction of 50%.  He had  diffuse occlusive disease of his coronaries and it was felt he should  undergo medical management.  He does have a history of prior peripheral  vascular occlusive disease and had a prior stenting in both iliac  arteries.  Angiogram at that time showed occlusion of his external iliac  artery on the right with reconstitution of flow in his right common  femoral artery.  He does report severe claudication symptoms.  He  reports that he has total leg claudication on the right and has to stop  walking.  He also has pain in both feet that is not related to walking  and also reports that he can awaken at night and have stiffness in his  right leg when he first arises in the morning.   PAST HISTORY:  Significant for prior myocardial infarction.  He does  have hypertension, elevated cholesterol.  He has had bilateral inguinal  hernia repairs.   FAMILY HISTORY:  Negative for premature atherosclerotic disease.   SOCIAL HISTORY:  He is single.  He has 3 children.  He is retired.  He  smokes and has cut down to 1/2-pack of cigarettes per day.  He does not  drink alcohol.   REVIEW OF SYSTEMS:  He has no weight loss or weight gain.  His reported  weight is 447 pounds.  He is 5 feet 9 inches tall.  CARDIAC:  Positive for chest pain, heart murmur, shortness of breath  with exertion.  PULMONARY:  Negative.  GI:  Positive for reflux, hiatal  hernia.  GU:  Negative.  VASCULAR:  Positive for claudication symptoms.  NEUROLOGIC:  Positive for headache.  MUSCULOSKELETAL:  Negative.  PSYCHIATRIC:  Negative.  ENT:  Negative.  HEMATOLOGIC:  Negative.  SKIN:  Negative.   PHYSICAL EXAM:  A well-developed, well-nourished white male appearing  stated age of 60.  He is in no acute distress.  Blood pressure 161/81,  pulse 96, respirations 18, saturations are 95% on room air.  HEENT is  normal.  Lungs:  Clear bilaterally with no rales, rhonchi, or wheezes.  Cardiovascular:  His heart is regular rate and rhythm without murmur.  His carotids are without bruit bilaterally.  He does have 2+ radial  pulses.  He has 2+ left femoral and 2+ dorsalis pedis pulse on the left.  He has absent femoral and distal pulses on the right.  Abdominal exam:  Soft, nontender, and no masses palpable.  Musculoskeletal:  Without  major deformity or cyanosis.  Neurologic:  No focal weakness or  paresthesias.  Skin:  Without ulcers or rashes.   I reviewed his extensive records provided by Dr. Excell Seltzer and, also, I  have reviewed his actual arteriogram.  This does show occlusion of a  prior placed stent in the external iliac  artery on the right with good  flow through the left iliac system.  He has a patent superficial femoral  artery and good runoff bilaterally.  I explained to the patient and his  family present that not all of his symptoms are related to arterial  sufficiency.  I explained that his resting pain in both feet is not  related to arterial insufficiency and also the symptoms that he has on  first arising are not related to arterial efficiency as well.  I did  explain the claudication symptoms are clearly related to his iliac  occlusive disease.  We explained that this certainly is the most  limiting problem and he wishes to have this corrected.  I explained the  option of left to right fem-fem bypass and he wishes to proceed with  this.  I  explained the potential complications including graft failure,  bleeding, and infection.  We will plan surgery on March 31st at [facility].     Larina Earthly, M.D.  Electronically Signed   TFE/MEDQ  D:  03/29/2009  T:  04/01/2009  Job:  [id number]   cc:   Veverly Fells. Excell Seltzer, MD  Art Buff, MD

## 2010-05-20 NOTE — Cardiovascular Report (Signed)
NAME:  Ralph [name], Ralph [name] NO.:  [id number]   MEDICAL RECORD NO.:  [id number]          PATIENT TYPE:  INP   LOCATION:  2899                         FACILITY:  [facility]   PHYSICIAN:  Veverly Fells. Excell Seltzer, MD  DATE OF BIRTH:  [birth year]   DATE OF PROCEDURE:  05/18/2006  DATE OF DISCHARGE:                            CARDIAC CATHETERIZATION   PROCEDURES:  1. Suprarenal abdominal aortography.  2. Distal aortic aortography.  3. Selective right iliac angiography with runoff.  4. Right iliac percutaneous transluminal angioplasty and self-      expanding stent placement via contralateral approach.  5. Starclose of the left femoral artery.   INDICATIONS:  Mr. Vorhees is a 60 year old gentleman who has severe right  leg claudication that affects the hip, buttocks and right calf.  He has  symptoms with minimal activity.  He underwent noninvasive physiologic  arterial study that demonstrated pressure dropoff proximally on the  right as well as distally on the right and I suspected that he had  multilevel disease.  He was brought for angiography with possible PTA  for further evaluation today.   Risks and indications of the procedure were explained to the patient.  Informed consent was obtained.  Both groins were prepped and draped  under normal sterile conditions.  Using the modified Seldinger  technique, a 5-French sheath was placed in the left femoral artery.  A  pigtail catheter was inserted into the aorta and a suprarenal aortogram  was performed.  Following that, a distal aortogram was performed to  evaluate the distal aorta and iliacs.  At that point, it was  demonstrated that Mr. Stilley had high-grade external iliac disease and a  short LIMA catheter was used to cross over from the left to the right  iliac.  Eventually, an end-hole catheter was passed over an angled  Glidewire and selective right iliac angiography was performed with a  runoff to the right foot.  Following  selective angiography of the iliac  with runoff, I elected to intervene on the right external iliac with PTA  and possible stenting.  The end-hole catheter was changed out for a 6-  [place] destination sheath which crossed over the aorta and sat in the  proximal right common iliac artery.  Heparin was given for  anticoagulation at a dose of 70 units per kg.  Once a therapeutic ACT  was achieved, a 4 x 6 cm balloon was used to predilate the lesion at 7  atmospheres for 60 seconds.  Following balloon dilatation, there was  greater than 50% residual stenosis present and I elected to stent with a  Prodigy Everflex 6 x 60 mm stent.  Once deployed, the stent was  postdilated with a 5 x 6 cm Powerflex balloon up to 8 atmospheres for 35  seconds.  At the conclusion of the procedure, the right external iliac  was widely patent with no residual stenosis.  A final image of right  infragenicular vessels was taken and there was persistent 3-vessel  runoff.  At that point, the sheath was removed and a Starclose  device  was used to seal the left femoral arteriotomy.  All catheter exchanges  were performed over a guidewire.  There were no immediate complications.   FINDINGS:  The right renal artery has a 40% proximal stenosis.  The left  renal artery is widely patent.  There are single renal arteries  bilaterally.  The distal aorta is widely patent.  The aortoiliac  bifurcation is normal on both sides.  The left common and external iliac  arteries are widely patent with some mild luminal irregularities in the  left external iliac artery.  The left internal iliac artery is patent.  On the right side, the right common iliac artery is widely patent.  The  right internal iliac artery is heavily calcified and significantly  diseased with diffuse 80% stenoses.  The right external iliac artery has  long area of high-grade stenosis in the range of 80-90%.  The right  common femoral is widely patent.  The right  profunda is patent.  The  right SFA has minor luminal irregularities throughout.  There is 3-  vessel runoff below the knee with a widely patent anterior tibial,  peroneal and posterior tibial artery.   FINAL IMPRESSIONS:  Severe right external iliac stenosis treated with  percutaneous transluminal angioplasty and a self-expanding stent in the  right external iliac artery.   RECOMMENDATIONS:  Aspirin lifelong with clopidogrel for a minimum of 30  days.  Will repeat ABIs in 2 weeks with regular followup thereafter.      Veverly Fells. Excell Seltzer, MD     MDC/MEDQ  D:  05/18/2006  T:  05/18/2006  Job:  [id number]   cc:   Learta Codding, MD,FACC

## 2010-05-20 NOTE — Assessment & Plan Note (Signed)
[facility]                          [facility] OFFICE NOTE   NAME:Ralph [name], Ralph [name]                          MRN:          [id number]  DATE:05/05/2006                            DOB:          [birth year]    CARDIOLOGIST:  Dr. Andee Lineman.   PRIMARY CARE PHYSICIAN:  None.   HISTORY OF PRESENT ILLNESS:  Ralph [name] is a 60 year old male patient who  has been followed intermittently in clinic, who has a history of  coronary disease and apparently had 2 myocardial infarctions in the  past, 1 at age 36 and 1 at age 34.  Apparently, his first one was  treated at [facility] and his second one treated at [facility].  The patient saw Dr.  Andee Lineman in March of 2008 for clearance for right inguinal hernia repair.  At that time, he was set up for Dobutamine stress echo.  This was  negative for ischemia.  He was apparently cleared for surgery.  He had  some medications added for his blood pressure.  He was also set up for  carotid Dopplers as well as ABIs.  He does have a history of  claudication on the right.  His carotid Dopplers revealed less than 50%  stenosis in the bilateral internal carotid arteries.  He has an ABI of  0.58 on the right and 0.93 on the left.  The patient returns today for  followup.   He denies any chest discomfort or shortness of breath.  Denies any  syncope or near syncope.  He has occasional palpitations and has had  this for many years without changes.  He apparently had a Holter monitor  placed some years ago.  His only real complaint to me today is that of  right leg claudication.  He notes exertional discomfort as well as  numbness that goes away with rest.  He denies any rest pain.  He denies  any headaches, blurred vision.   CURRENT MEDICATIONS:  1. Lisinopril 10 mg daily.  2. Ziac 0.25 mg/6.25 mg daily.  3. Aspirin 81 mg daily.   ALLERGIES:  No known drug allergies.   PHYSICAL EXAMINATION:  He is a well-nourished, well-developed male in no  distress.  Blood pressure 199/89, pulse 82, weight 132.8 pounds.  Repeat blood  pressure by me by manual cuff is 180/90 on the left, 188/96 on the  right.  HEENT:  Unremarkable.  NECK:  Without JVD.  LYMPH:  Without lymphadenopathy.  CARDIAC:  Normal S1, S2.  Regular rate and rhythm without murmurs.  LUNGS:  Clear to auscultation bilaterally with no wheezing, rhonchi, or  rales.  ABDOMEN:  Soft, nontender, with normoactive bowel sounds, positive bruit  heard midline to the left.  EXTREMITIES:  Without edema.  Calves are soft, nontender.  SKIN:  Warm and dry.  NEUROLOGIC:  He is alert and oriented x3.  Cranial nerves II-XII are  grossly intact.   IMPRESSION:  1. Coronary artery disease.      a.     History of myocardial infarction.      b.  Status post percutaneous coronary intervention in the past,       details unavailable.  2. Uncontrolled hypertension.  3. Dyslipidemia, untreated.  4. Peripheral arterial disease with abnormal ABIs (0.58 on the right,      0.93 on the left).  5. Cerebral vascular disease with less than 50% bilateral internal      carotid artery stenosis by recent carotid Dopplers.  6. Abdominal bruit.  7. Recent history of right inguinal hernia repair.  8. Gastroesophageal reflux disease.  9. Palpitations.  10.Ongoing tobacco abuse.  11.History of alcohol abuse.   PLAN:  The patient presents to the office today for followup.  His blood  pressure is still quite uncontrolled.  He is quite noncompliant with  diet and admits to me today that he ate a whole can of biscuit gravy  before coming to the office.  He also smokes a pack of cigarettes per  day.  I have counseled him on diet as well as discontinuing tobacco.  I  have also counseled him on decreasing his alcohol intake as this will  all effect his blood pressure.  He needs better blood pressure control  and I have increased his Lisinopril to 20 mg a day and his Ziac to  5/6.25 mg a day.  He will get  a repeat BMET in a week.   The patient does have intermittent claudication on the right, and ABIs  with 0.58 on the right.  I have referred him to Dr. Samule Ohm or Dr. Excell Seltzer  in [place] for further recommendations.  He does have an abdominal  bruit.  Will obtain an abdominal ultrasound to rule out abdominal aortic  aneurysm as well as renal artery stenosis prior to seeing Dr. Samule Ohm or  Dr. Excell Seltzer.  If he has significant renal artery stenosis, then he will  need evaluation for that as well.   He does need cholesterol therapy.  I have placed him on simvastatin 20  mg nightly.  He will need lipids and LFT's again in about 6-8 weeks.  I  will also check a BMET in a week given the increase in his Lisinopril  dose.   He will be set up for carotid Dopplers again in 1 year's time.   Will bring him back in followup in this office in about 4 weeks.  He  will return in a week for a blood pressure check with the nurse.      Tereso Newcomer, PA-C       Learta Codding, MD,FACC    SW/MedQ  DD: 05/05/2006  DT: 05/05/2006  Job #: [id number]

## 2010-05-20 NOTE — Cardiovascular Report (Signed)
NAME:  Ralph [name], KWONG NO.:  [id number]   MEDICAL RECORD NO.:  [id number]          PATIENT TYPE:  AMB   LOCATION:  SDS                          FACILITY:  [facility]   PHYSICIAN:  Veverly Fells. Excell Seltzer, MD  DATE OF BIRTH:  02/25/1950   DATE OF PROCEDURE:  07/16/2006  DATE OF DISCHARGE:  07/16/2006                            CARDIAC CATHETERIZATION   PROCEDURE:  Distal abdominal aortic angiogram, selective right iliac  angiogram with runoff to the right foot.   INDICATIONS:  Mr. Goshorn is a 60 year old gentleman who presented with  marked right leg claudication back in May of this year.  He was  evaluated and was found to have high-grade right iliac stenosis and was  treated with a stent in the external iliac.  He had dramatic symptom  relief for approximately 1 month and then his symptoms suddenly  worsened.  He has severe claudication and his ABI on the right is 0.48.  A duplex scan demonstrated stent occlusion and and reconstitution of  flow via collaterals into the right common femoral artery.  He was  brought back for a repeat angiogram today.   Risks and indications of the procedure were explained to the patient and  informed consent was obtained.  The left groin was prepped and draped  under normal sterile conditions.  Using a modified Seldinger technique,  a 5-French sheath was placed in the right femoral artery without  difficulty.  A pigtail catheter was inserted into the distal aorta and a  distal aortogram was performed.  Following distal aortography, a SOS  catheter was inserted selectively into the right common iliac artery and  selective right iliac angiography was performed.  A runoff to the right  foot was also performed.  At the conclusion of the procedure, the  catheter was wired out and the patient tolerated the procedure well  without any immediate complication.  All catheter exchanges were  performed over a guidewire.   FINDINGS:  The distal aorta is  angiographically normal.  The left iliac  artery, the left common and external iliac arteries are angiographically  normal.  The left common femoral artery tapers into a smaller vessel,  but there is no focal stenosis.   On the right, the common iliac artery is normal.  The right hypogastric  artery is severely diseased, but is patent.  Just at the origin of the  stent in the external iliac artery there is a flush occlusion.  The  entire 6 cm of stented segment are occluded and flow reconstitutes in  the common femoral artery via collaterals from the hypogastric.  There  are minor luminal irregularities in the SFA throughout.  The popliteal  has minor luminal irregularities.  There is three-vessel runoff to the  foot without any significant stenoses.  There is slow flow in  infragenicular vessels.  The right profunda femoris is patent.   ASSESSMENT:  1. 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  2. Three-vessel runoff to the foot.  I reviewed the films and I am not very hopeful of long-term patency via  an endovascular approach.  In addition, the occlusion appears to extend  down into the common femoral artery, which limits endovascular options.  I reviewed  the study with Dr. Arbie Cookey, who agreed that surgical treatment would  provide the best long-term result.  He would likely be a good candidate  for a left-to-right fem-fem bypass.  We will make arrangements for Mr.  Hennes to see Dr. Arbie Cookey in the office.  I will discontinue his Plavix and  continue him on aspirin alone.      Veverly Fells. Excell Seltzer, MD  Electronically Signed     MDC/MEDQ  D:  07/16/2006  T:  07/17/2006  Job:  [id number]   cc:   Learta Codding, MD,FACC

## 2010-05-20 NOTE — Progress Notes (Signed)
[facility]                        PERIPHERAL VASCULAR OFFICE NOTE   NAME:Ralph [name]                          MRN:          [id number]  DATE:07/12/2006                            DOB:          October 03, 1950    HISTORY OF PRESENT ILLNESS:  Ralph [name] returns for followup at the  [facility] on July 11, 2005. He is a 60 year old  gentleman known to me from a previous evaluation in early May of this  year. He had classic symptoms of right leg claudication. His ABI at that  time on the right side was 0.58. I elected to perform an angiogram due  to his marked symptoms and this was done on May 18, 2006. The right  external iliac artery had an 80% to 90% stenosis, which was treated with  a 6 x 60 mm self-expanding stent. Following the procedure, Ralph [name] had  marked improvement in his leg claudication. He was able to walk better  than he had been able in several years. Unfortunately, approximately 2  weeks ago, he developed the sudden onset of numbness and tingling in his  right leg, as well as increased pain. His symptoms are worse than they  were, even prior to the procedure. He describes diffuse pain down the  entire right leg with low level activity. He has some discomfort at  rest. He complains of some numbness at rest as well. His foot has not  been cool to the touch. He has had no other complaints. His symptoms  have been stable over the past few weeks.   CURRENT MEDICATIONS:  Include Simvastatin 20 mg at bedtime, aspirin 81  mg daily, Bisoprolol HCT 5/6.25 mg daily, Lisinopril 40 mg daily, and  Plavix 75 mg daily, which he has recently completed and discontinued.   PHYSICAL EXAMINATION:  VITAL SIGNS:  Blood pressure 180/100 in the right  arm, 192/100 in the left arm. I have rechecked his blood pressure in the  left arm and it was 190/95. Heart rate is 88. Respiratory rate 16.  HEENT:  Normal. Normal carotid upstrokes without  bruits. Jugular venous  pressure is normal.  LUNGS:  Clear to auscultation bilaterally.  HEART:  Regular rate and rhythm. Without murmurs or gallops.  ABDOMEN:  Soft, nontender. No organomegaly. No abdominal bruits.  EXTREMITIES:  No clubbing, cyanosis, or edema. The left leg has 2+  pulses in the femoral, popliteal, dorsalis pedis, and posterior tibial  positions. On the right leg, I am unable to palpate any pulses. Both  feet are warm and appear well perfused. There is no pallor of the legs.   LABORATORY DATA:  Arterial Doppler study from July 05, 2006 demonstrated  no flow within the right external iliac stent. The common femoral artery  fills from a collateral, as well as from retrograde flow from the  profunda. The anterior tibial and posterior tibial wave forms on the  right arm monophasic. On the left side, the anterior tibial and  posterior tibial wave forms are brisk and biphasic.   ASSESSMENT/PLAN:  Ralph. Comunale has both clinical  evidence and arterial  Doppler evidence of iliac stent occlusion. He has collateral flow  present and hence, does not have critical limb ischemia. However, his  symptoms are certainly bothersome and will warrant repeat angiographic  study, as he is unable to do much walking at all, secondary to severe  right leg claudication. I have reviewed this in detail with him today  and have scheduled him for an angiogram on July 16, 2006. We will  restart his Plavix and I have asked him to continue aspirin. I have  again urged him regarding smoking cessation. Depending on the results of  the angiogram, he will likely warrant re-intervention and hopefully, the  occluded stent will be amenable to percutaneous intervention. If he has  recurrent problems, we may need to consider vascular surgery but my  hopes are that endovascular treatment will work.   I counseled him today regarding signs and symptoms of a cold leg and he  understands.     Veverly Fells. Excell Seltzer,  MD  Electronically Signed    MDC/MedQ  DD: 07/12/2006  DT: 07/12/2006  Job #: [id number]   cc:   Learta Codding, MD,FACC

## 2010-05-20 NOTE — Letter (Signed)
May 13, 2006    Learta Codding, M.D.  [address]  [place], [place] [address]   RE:  Ralph, [name]  MRN:  [id number]  /  DOB:  07/29/1950   Dear Michelle Piper:   It was my pleasure to see Ralph [name] in consultation today for his  peripheral vascular disease.   As you know, he is a 60 year old man who presented with chief complaint  of right leg pain.   Ralph [name] describes 5 years of right leg claudication where he complains  of right hip, thigh and calf pain with walking. His symptoms have been  progressive over the last six months. Currently, he can only walk about  25 feet without stopping. Once he stops and rests, this pain goes away  within about five minutes. He had some resting leg symptoms that are  different from his claudication. He complains of numbness and tingling  in both thighs and sometimes down to the feet. He denies any nonhealing  ulcers or other skin problems. He has no prior history of stroke or TIA.  He has no other complaints at this time.   CURRENT MEDICATIONS:  1. Ziac 5/6.25 mg daily.  2. Lisinopril 40 mg daily.  3. Simvastatin 20 mg at bedtime.  4. Aspirin 81 mg daily.   ALLERGIES:  No known drug allergies.   PAST MEDICAL HISTORY:  Pertinent for:  1. Coronary artery disease with remote angioplasty. He reports      undergoing coronary angioplasty in the 1980s.  2. Severe hypertension.  3. Cerebrovascular disease without prior TIA or stroke.  4. Dyslipidemia.  5. Peripheral arterial disease with abnormal ABIs (see details below).  6. Gastroesophageal reflux disease.  7. Tobacco abuse.  8. History of alcohol abuse.  9. Recent right inguinal hernia repair.   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 He is divorced and has three  children. He smokes one pack of cigarettes daily and has reduced his  alcohol consumption to about six beers per week.   FAMILY HISTORY:  Several family members  have died from cancer. There is  no history of peripheral arterial disease in the family.   REVIEW OF SYSTEMS:  A complete 12-point review of systems was performed.  Pertinent positives included fatigue, hiatal hernia, gastroesophageal  reflux, palpitations. All other systems were reviewed and are negative  except as detailed above.   PHYSICAL EXAMINATION:  The patient is alert and oriented. He is in no  acute distress. He is a thin, tan, white male. Weight is 127 pounds.  Blood pressure is 100/80 in the right arm, 106/80 in the left arm. Heart  rate 68. Respiratory rate 16.  HEENT:  Is normal with the exception that the patient is edentulous.  NECK:  Normal carotid upstrokes with soft bilateral bruits. Jugular  venous pressure is normal. There is no thyromegaly or thyroid nodules.  LUNGS:  Are clear to auscultation bilaterally.  CARDIOVASCULAR:  The apex is discrete and nondisplaced.  HEART:  Regular rate and rhythm without murmurs or gallops.  ABDOMEN:  Soft, thin, nontender. The aorta is pulsatile, but it does not  feel enlarged. There is abdominal bruit present. Bowel sounds are  present.  BACK:  There is no flank tenderness.  EXTREMITIES:  There is a well healing incision from the right inguinal  hernia repair present. Femoral pulse on the right is  1+, on the left is  2+. There are bilateral femoral bruits, right greater than left. The  popliteal, dorsalis pedis and posterior tibial pulses are all palpable  on the left. None of those pulses are palpable on the right leg. There  are no ulcers present on the feet.  SKIN:  Warm and dry with mild dependent rubor of the right foot.  Cranial nerves II-XII are intact. Strength is 5/5 and equal in the arms  and legs bilaterally.   DATA REVIEWED:  ABI with lower extremity physiologic study from [facility]  shows an ABI of 0.58 on the right and 0.93 on the left. Segmental  pressures are suggestive of multilevel disease involving the right  leg.  Left leg segmental pressures are not suggestive of significant  obstructive disease. Waveforms are reviewed and are blunted throughout  the right leg, and on the left, the waveforms are at least biphasic.   ASSESSMENT:  Ralph [name] is a 60 year old gentleman who clearly has  significant peripheral arterial disease involving the right leg. By exam  and consistent with his noninvasive study, he likely has multilevel  disease. I think he has significant proximal disease which would be  consistent with his hip and thigh pain, but he probably also has  significant SFA disease as there is a pressure drop between his thigh  and calf. The left leg does not appear to be significantly affected. I  have reviewed this in detail with Ralph [name] today, and with his marked  symptoms of minimal activity, I think we should proceed directly to  angiogram with potential for PTA of the right lower extremity. I advised  him that until we determine his anatomy I am unsure whether his  peripheral arterial disease will be amendable to percutaneous treatment  or whether he may require surgery.   From a standpoint of medical therapy, you have obviously started him on  appropriate medicines, and I discussed at length with him that the  biggest factor regarding how well he does relating to his peripheral  arterial disease will be smoking cessation.   Thanks for allowing me to evaluate Ralph [name]. I will be in touch with you  at the time of his arteriogram. Feel free to call at any time with  questions regarding his care.    Sincerely,      Veverly Fells. Excell Seltzer, MD    MDC/MedQ  DD: 05/13/2006  DT: 05/13/2006  Job #: [id number]

## 2010-05-23 NOTE — Assessment & Plan Note (Signed)
[facility]                          [facility] NOTE   NAME:Ralph [name], Ralph PATRAS                          MRN:          [id number]  DATE:03/18/2005                            DOB:          [birth year]    ADDENDUM:   PROBLEM LIST:  1. Coronary artery disease.      a.     History of myocardial infarction.      b.     Stable symptoms.      c.     Status post percutaneous coronary intervention, details       unavailable.  2. Hypertension, uncontrolled.  3. Rule out peripheral vascular disease with claudication, right lower      extremity.  4. Multiple cardiac risk factors.      a.     Tobacco use.      b.     Hypertension.      c.     Dyslipidemia.  5. Right inguinal hernia.  6. Gastroesophageal reflux disease.  7. Palpitations.   PLAN:  1. The patient has known coronary artery disease and is likely an      increased risk for surgery, although he does not have any unstable      symptoms of angina.  2. The patient will proceed with Cardiolite stress testing and if does      not show any gross ischemia the patient likely can proceed with      hernia surgery.  3. The patient will have ABIs given his complaints of claudication.  4. Hypertension has been poorly controlled due to noncompliance.  I      have started the patient both on hydrochlorothiazide and      lisinopril.  He will have a BMET in one week and blood pressure      followup.  He will need to have his medications continued around      the surgery, particularly his beta blocker therapy for reduced risk      reduction in regards to perioperative myocardial infarction.  5. The patient also will follow up with [place] in our office for further      management of his cardiovascular risk factors.     Learta Codding, MD,FACC  Electronically Signed    GED/MedQ  DD: 03/22/2006  DT: 03/22/2006  Job #: [id number]

## 2010-05-23 NOTE — Assessment & Plan Note (Signed)
[facility]                          [facility] NOTE   NAME:[name], [name]                          MRN:          [id number]  DATE:03/19/2006                            DOB:          1950/08/16    REASON FOR CONSULTATION:  Preoperative clearance prior to right inguinal  hernia repair.   HISTORY OF PRESENT ILLNESS:  The patient is a 60 year old male  previously seen in the regional clinic in 2002.  The patient has known  coronary artery disease.  He had apparently 2 myocardial infarctions in  the past one at age 4, one at age 29.  He was treated at [facility] the  first time and at [facility] the last time.  The patient reports that he had  an angioplasty done, but does not remember the details.  No records are  available also on this procedure.  The patient did not receive any  followup after he had his visit in [place].  He has non multiple  cardiac risk factors including heavy tobacco use.  The patient also  reports a claudication to right calf when he walks up one flight of  steps.  He denies however any chest pain.  The patient has uncontrolled  hypertension and again he has not had any medical care over the last  several years.  Blood pressure 160/90 in the office today.  He also  reports shortness of breath on moderate exertion.  He has no chest pain  at rest, he has no palpitations or syncope.  The patient reports  drinking several Pepsi's a day and uses several goodie powders a day in  conjunction.  He also drinks a 6 pack a day of beer.   ALLERGIES:  No known drug allergies.   MEDICATIONS:  None.   SOCIAL HISTORY:  As detailed above.  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  He smokes several and he drinks alcohol as  outlined above.   FAMILY HISTORY:  Notable for several family members, including mother,  father, sister and brother who died from cancer.   REVIEW OF SYSTEMS:  As per history of present illness.  The  patient  denies any fever or chills, no headaches, positive for palpitations, no  cough, shortness of breath on exertion, no chest pain at rest and no  weakness or tremors, no pruritus, pain in the right groin related to his  hernia, claudication as outlined above.   PHYSICAL EXAMINATION:  VITAL SIGNS:  Blood pressure 163/90, heart rate  is 99 beats per minute.  GENERAL:  A somewhat cachectic appearing white male in no apparent  distress.  HEENT:  Pupils are isoechoic.  Conjunctivae clear.  NECK:  Supple, normal carotid upstroke and definite carotid bruits.  LUNGS:  Diminished breath sounds bilaterally.  HEART:  Regular rate and rhythm, normal S1, S2, no murmur, rubs or  gallops.  ABDOMEN:  Soft, nontender, no rebound, good bowel sounds.  EXTREMITY:  Absent pulses in the right lower extremity, dorsalis pedis  in posterior and tibial, 1+ peripheral pulses in the  PT and dorsalis  pedis in the left leg.   A 12-lead electrocardiogram with normal sinus rhythm with no acute  ischemic changes.   PROBLEM LIST:  1. Coronary artery disease.      a.     History of myocardial infarction.      b.     Stable symptoms.      c.     Status post percutaneous coronary intervention, details       unavailable.  2. Hypertension, uncontrolled.  3. Rule out peripheral vascular disease with claudication, right lower      extremity.  4. Multiple cardiac risk factors.      a.     Tobacco use.      b.     Hypertension.      c.     Dyslipidemia.  5. Right inguinal hernia.  6. Gastroesophageal reflux disease.  7. Palpitations.   PLAN:  1. The patient has known coronary artery disease and is likely an      increased risk for surgery, although he does not have any unstable      symptoms of angina.  2. The patient will proceed with Cardiolite stress testing and if does      not show any gross ischemia the patient likely can proceed with      hernia surgery.  3. The patient will have ABIs given his  complaints of claudication.  4. Hypertension has been poorly controlled due to noncompliance.  I      have started the patient both on hydrochlorothiazide and      lisinopril.  He will have a BMET in one week and blood pressure      followup.  He will need to have his medications continued around      the surgery, particularly his beta blocker therapy for reduced risk      reduction in regards to perioperative myocardial infarction.  5. The patient also will follow up with [place] in our office for further      management of his cardiovascular risk factors.     Learta Codding, MD,FACC  Electronically Signed    GED/MedQ  DD: 03/22/2006  DT: 03/22/2006  Job #: [id number]   cc:   Erskine Speed, M.D.

## 2010-07-03 ENCOUNTER — Encounter: Payer: Self-pay | Admitting: Cardiology

## 2010-12-03 IMAGING — CR DG CHEST 2V
2 series · 2 of 2 positions shown · non-contrast
Comparison: 05/18/2006

CLINICAL DATA: Pre admit for iliac occlusion

CHEST - 2 VIEW

[view not recorded (1 of 2)]
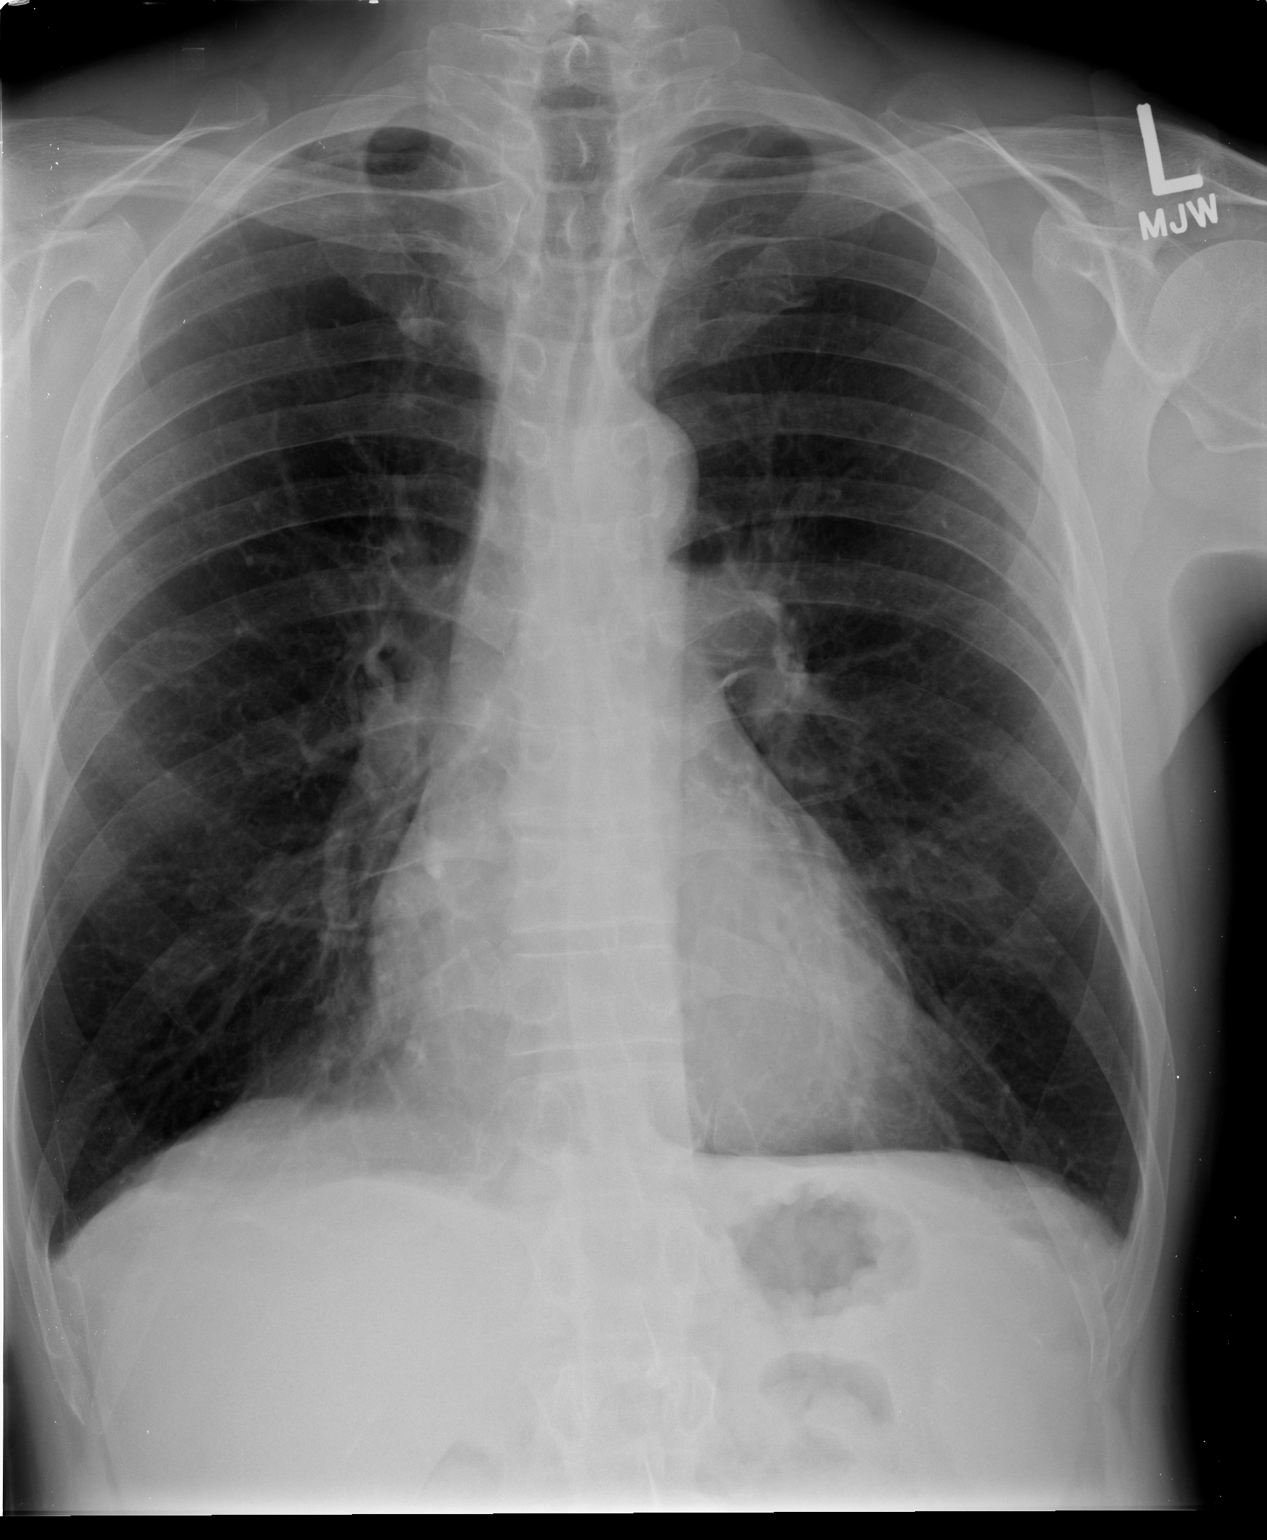

[view not recorded (2 of 2)]
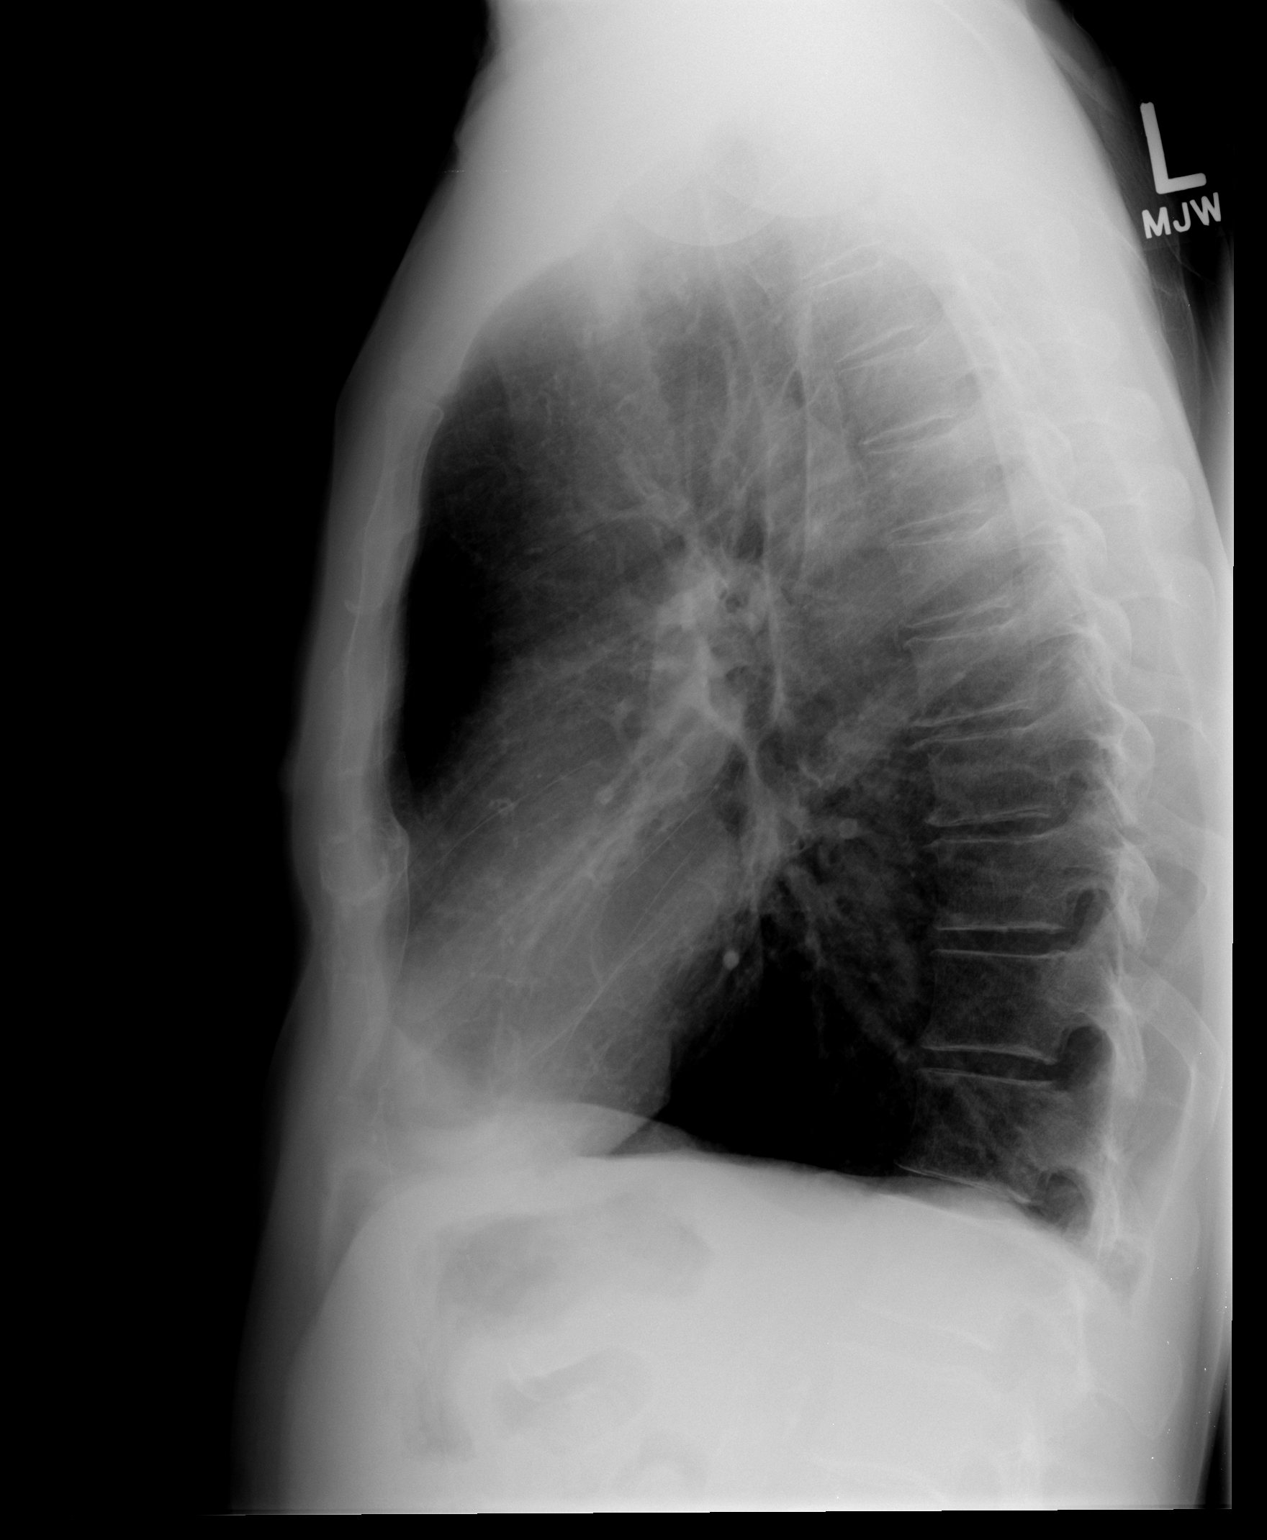

[2 of 2 positions shown; findings below may reference images not displayed]

FINDINGS: Heart size within normal limits.  Lungs hyperaerated
consistent with COPD.  No active disease.  Osseous structures
intact.
IMPRESSION: COPD - no active disease.

## 2011-02-18 ENCOUNTER — Encounter: Payer: Self-pay | Admitting: Physician Assistant

## 2011-02-19 ENCOUNTER — Ambulatory Visit: Payer: Medicaid Other | Admitting: Thoracic Diseases

## 2011-08-06 ENCOUNTER — Encounter: Payer: Self-pay | Admitting: Vascular Surgery

## 2011-10-01 ENCOUNTER — Encounter: Payer: Self-pay | Admitting: Neurosurgery

## 2011-10-02 ENCOUNTER — Ambulatory Visit: Payer: Self-pay | Admitting: Neurosurgery

## 2011-10-12 ENCOUNTER — Other Ambulatory Visit: Payer: Self-pay | Admitting: *Deleted

## 2011-10-12 DIAGNOSIS — Z48812 Encounter for surgical aftercare following surgery on the circulatory system: Secondary | ICD-10-CM

## 2011-10-12 DIAGNOSIS — I739 Peripheral vascular disease, unspecified: Secondary | ICD-10-CM

## 2011-10-13 ENCOUNTER — Encounter: Payer: Self-pay | Admitting: Neurosurgery

## 2011-10-14 ENCOUNTER — Encounter: Payer: Self-pay | Admitting: Neurosurgery

## 2011-10-14 ENCOUNTER — Encounter (INDEPENDENT_AMBULATORY_CARE_PROVIDER_SITE_OTHER): Payer: Medicare Other | Admitting: Vascular Surgery

## 2011-10-14 ENCOUNTER — Ambulatory Visit (INDEPENDENT_AMBULATORY_CARE_PROVIDER_SITE_OTHER): Payer: Medicare Other | Admitting: Neurosurgery

## 2011-10-14 VITALS — BP 191/92 | HR 72 | Resp 18 | Ht 69.0 in | Wt 145.0 lb

## 2011-10-14 DIAGNOSIS — I739 Peripheral vascular disease, unspecified: Secondary | ICD-10-CM

## 2011-10-14 DIAGNOSIS — Z48812 Encounter for surgical aftercare following surgery on the circulatory system: Secondary | ICD-10-CM

## 2011-10-14 NOTE — Progress Notes (Signed)
[facility] Office Note  CC: PAD surveillance Referring Physician: Early  History of Present Illness: 61 year old male patient of Dr. Arbie Cookey is history of a right TIA stent occlusion, left right fem-fem bypass graft in 2011. The patient states he does have increased claudication, he can walk about 100 feet and has to stop. The patient has cramps at night in his legs and feet and feels like this has worsened over the last year. The patient denies rest pain other than the cramping and has no open ulcerations on the lower extremities.  Past Medical History  Diagnosis Date  . S/P PTCA (percutaneous transluminal coronary angioplasty)   . Coronary artery disease     S/p cardiac catheterization 3/11 moderate diffuse 3-vessel CAD with normal LV function  . Palpitations   . GERD (gastroesophageal reflux disease)   . Inguinal hernia, right   . Dyslipidemia     Untreated  . History of tobacco abuse     Ongoing  . History of myocardial infarction     At age 53 and again at age 69  . Intermittent claudication     Right leg; S/p right iliac occlusive disease s/p left to right femoral-femoral bypass surgery  . COPD (chronic obstructive pulmonary disease)   . Hypertension     Uncontrolled  . Peripheral arterial disease     With abnormal ABIs (0.58 on right, 0.93 on left)  . Cerebral vascular disease     With less than 50% bilateral internal carotid artery stenosis by recent carotid Dopplers  . Abdominal bruit   . History of alcohol abuse     ROS: [x]  Positive   [ ]  Denies    General: [ ]  Weight loss, [ ]  Fever, [ ]  chills Neurologic: [ ]  Dizziness, [ ]  Blackouts, [ ]  Seizure [ ]  Stroke, [ ]  "Mini stroke", [ ]  Slurred speech, [ ]  Temporary blindness; [ ]  weakness in arms or legs, [ ]  Hoarseness Cardiac: [ ]  Chest pain/pressure, [ ]  Shortness of breath at rest [ ]  Shortness of breath with exertion, [ ]  Atrial fibrillation or irregular heartbeat Vascular: [  ] Pain in legs with walking, [ ]  Pain in legs at rest, [ ]  Pain in legs at night,  [ ]  Non-healing ulcer, [ ]  Blood clot in vein/DVT,   Pulmonary: [ ]  Home oxygen, [ ]  Productive cough, [ ]  Coughing up blood, [ ]  Asthma,  [ ]  Wheezing Musculoskeletal:  [ ]  Arthritis, [ ]  Low back pain, [ ]  Joint pain Hematologic: [ ]  Easy Bruising, [ ]  Anemia; [ ]  Hepatitis Gastrointestinal: [ ]  Blood in stool, [ ]  Gastroesophageal Reflux/heartburn, [ ]  Trouble swallowing Urinary: [ ]  chronic Kidney disease, [ ]  on HD - [ ]  MWF or [ ]  TTHS, [ ]  Burning with urination, [ ]  Difficulty urinating Skin: [ ]  Rashes, [ ]  Wounds Psychological: [ ]  Anxiety, [ ]  Depression   Social History History  Substance Use Topics  . Smoking status: Current Every Day Smoker -- 50 years  . Smokeless tobacco: Never Used  . Alcohol Use: Yes    Family History Family History  Problem Relation Age of Onset  . Other Neg Hx     Peripheral artery disease  . Cancer Other   . Cancer Mother   . Cancer Father   . Cancer Sister   . Cancer Brother     No Known Allergies  Current Outpatient Prescriptions  Medication Sig Dispense Refill  .  albuterol-ipratropium (COMBIVENT) 18-103 MCG/ACT inhaler Inhale 2 puffs into the lungs 4 (four) times daily.        . nitroGLYCERIN (NITROSTAT) 0.4 MG SL tablet Place 0.4 mg under the tongue every 5 (five) minutes as needed. May repeat up to 3 doses.       Marland Kitchen amLODipine (NORVASC) 10 MG tablet Take 1 tablet (10 mg total) by mouth daily.  30 tablet  11  . aspirin 81 MG EC tablet Take 81 mg by mouth daily.        . hydrochlorothiazide (,MICROZIDE/HYDRODIURIL,) 12.5 MG capsule Take 12.5 mg by mouth daily.        . isosorbide mononitrate (IMDUR) 30 MG 24 hr tablet Take 30 mg by mouth daily.        Marland Kitchen lisinopril (PRINIVIL,ZESTRIL) 20 MG tablet Take 20 mg by mouth daily.        Marland Kitchen omeprazole (PRILOSEC OTC) 20 MG tablet Take 20 mg by mouth daily.        . simvastatin (ZOCOR) 20 MG tablet Take 1 tablet  (20 mg total) by mouth every evening.  30 tablet  11    Physical Examination  Filed Vitals:   10/14/11 1538  BP: 191/92  Pulse: 72  Resp: 18    Body mass index is 21.41 kg/(m^2).  General:  WDWN in NAD Gait: Normal HEENT: WNL Eyes: Pupils equal Pulmonary: normal non-labored breathing , without Rales, rhonchi,  wheezing Cardiac: RRR, without  Murmurs, rubs or gallops; No carotid bruits Abdomen: soft, NT, no masses Skin: no rashes, ulcers noted Vascular Exam/Pulses: Lower extremity pulses are not palpable, no carotid bruits are heard  Extremities without ischemic changes, no Gangrene , no cellulitis; no open wounds;  Musculoskeletal: no muscle wasting or atrophy  Neurologic: A&O X 3; Appropriate Affect ; SENSATION: normal; MOTOR FUNCTION:  moving all extremities equally. Speech is fluent/normal  Non-Invasive Vascular Imaging: ABIs are 0.75 bilaterally in monophasic to biphasic on the right and monophasic on the left when compared to last study in February 2012 this is virtually unchanged.  ASSESSMENT/PLAN: This is a patient with worsening claudication. The patient states he does not currently have insurance but does want to talk to Dr. Arbie Cookey about intervention. The patient will followup in 3 months with Dr. Arbie Cookey, I did not order labs for that day until Dr. Arbie Cookey evaluates the patient. Hopefully the patient will have insurance by that time and can proceed with any intervention needed. The patient's questions were encouraged and answered, he is in agreement with this plan.  Lauree Chandler ANP  Clinic M.D.: Edilia Bo

## 2012-01-11 ENCOUNTER — Encounter: Payer: Self-pay | Admitting: Vascular Surgery

## 2012-01-12 ENCOUNTER — Ambulatory Visit: Payer: Medicare Other | Admitting: Vascular Surgery

## 2015-05-20 ENCOUNTER — Other Ambulatory Visit: Payer: Self-pay | Admitting: Vascular Surgery

## 2015-05-20 DIAGNOSIS — I70213 Atherosclerosis of native arteries of extremities with intermittent claudication, bilateral legs: Secondary | ICD-10-CM

## 2015-05-20 DIAGNOSIS — I70221 Atherosclerosis of native arteries of extremities with rest pain, right leg: Secondary | ICD-10-CM

## 2015-05-20 DIAGNOSIS — I70262 Atherosclerosis of native arteries of extremities with gangrene, left leg: Secondary | ICD-10-CM

## 2015-05-21 ENCOUNTER — Other Ambulatory Visit: Payer: Self-pay | Admitting: Vascular Surgery

## 2015-05-24 ENCOUNTER — Ambulatory Visit
Admission: RE | Admit: 2015-05-24 | Discharge: 2015-05-24 | Disposition: A | Discharge status: Home / Self Care | Payer: Medicare Other | Source: Ambulatory Visit | Attending: Vascular Surgery | Admitting: Vascular Surgery

## 2015-05-24 DIAGNOSIS — K5730 Diverticulosis of large intestine without perforation or abscess without bleeding: Secondary | ICD-10-CM | POA: Diagnosis not present

## 2015-05-24 DIAGNOSIS — I70221 Atherosclerosis of native arteries of extremities with rest pain, right leg: Secondary | ICD-10-CM

## 2015-05-24 DIAGNOSIS — I70213 Atherosclerosis of native arteries of extremities with intermittent claudication, bilateral legs: Secondary | ICD-10-CM | POA: Diagnosis not present

## 2015-05-24 LAB — POCT I-STAT CREATININE: Creatinine, Ser: 1.1 mg/dL (ref 0.61–1.24)

## 2015-05-24 MED ORDER — IOPAMIDOL (ISOVUE-370) INJECTION 76%
125.0000 mL | Freq: Once | INTRAVENOUS | Status: AC | PRN
  Administered 2015-05-24: 125 mL via INTRAVENOUS

## 2015-05-28 ENCOUNTER — Ambulatory Visit: Payer: Medicare Other

## 2015-05-30 ENCOUNTER — Other Ambulatory Visit
Admission: RE | Admit: 2015-05-30 | Discharge: 2015-05-30 | Disposition: A | Discharge status: Home / Self Care | Payer: Medicare Other | Source: Ambulatory Visit | Attending: Vascular Surgery | Admitting: Vascular Surgery

## 2015-05-30 DIAGNOSIS — I739 Peripheral vascular disease, unspecified: Secondary | ICD-10-CM | POA: Insufficient documentation

## 2015-05-30 LAB — CREATININE, SERUM
Creatinine, Ser: 0.87 mg/dL (ref 0.61–1.24)
GFR calc Af Amer: >60 mL/min (ref >60)
GFR calc non Af Amer: >60 mL/min (ref >60)

## 2015-05-30 LAB — BUN: BUN: 16 mg/dL (ref 6–20)

## 2015-05-31 ENCOUNTER — Encounter
Admission: RE | Disposition: A | Discharge status: Home / Self Care | Payer: Self-pay | Source: Ambulatory Visit | Attending: Vascular Surgery

## 2015-05-31 ENCOUNTER — Encounter: Payer: Self-pay | Admitting: *Deleted

## 2015-05-31 ENCOUNTER — Ambulatory Visit
Admission: RE | Admit: 2015-05-31 | Discharge: 2015-05-31 | Disposition: A | Discharge status: Home / Self Care | Payer: Medicare Other | Source: Ambulatory Visit | Attending: Vascular Surgery | Admitting: Vascular Surgery

## 2015-05-31 DIAGNOSIS — F1010 Alcohol abuse, uncomplicated: Secondary | ICD-10-CM | POA: Insufficient documentation

## 2015-05-31 DIAGNOSIS — M2550 Pain in unspecified joint: Secondary | ICD-10-CM | POA: Diagnosis not present

## 2015-05-31 DIAGNOSIS — I213 ST elevation (STEMI) myocardial infarction of unspecified site: Secondary | ICD-10-CM | POA: Diagnosis not present

## 2015-05-31 DIAGNOSIS — I70268 Atherosclerosis of native arteries of extremities with gangrene, other extremity: Secondary | ICD-10-CM | POA: Insufficient documentation

## 2015-05-31 DIAGNOSIS — Z833 Family history of diabetes mellitus: Secondary | ICD-10-CM | POA: Diagnosis not present

## 2015-05-31 DIAGNOSIS — I999 Unspecified disorder of circulatory system: Secondary | ICD-10-CM | POA: Insufficient documentation

## 2015-05-31 DIAGNOSIS — I10 Essential (primary) hypertension: Secondary | ICD-10-CM | POA: Diagnosis not present

## 2015-05-31 DIAGNOSIS — E7800 Pure hypercholesterolemia, unspecified: Secondary | ICD-10-CM | POA: Diagnosis not present

## 2015-05-31 DIAGNOSIS — Z959 Presence of cardiac and vascular implant and graft, unspecified: Secondary | ICD-10-CM | POA: Insufficient documentation

## 2015-05-31 DIAGNOSIS — M79606 Pain in leg, unspecified: Secondary | ICD-10-CM | POA: Insufficient documentation

## 2015-05-31 DIAGNOSIS — M7989 Other specified soft tissue disorders: Secondary | ICD-10-CM | POA: Diagnosis not present

## 2015-05-31 DIAGNOSIS — F17200 Nicotine dependence, unspecified, uncomplicated: Secondary | ICD-10-CM | POA: Diagnosis not present

## 2015-05-31 HISTORY — PX: PERIPHERAL VASCULAR CATHETERIZATION: SHX172C

## 2015-05-31 SURGERY — LOWER EXTREMITY ANGIOGRAPHY
Anesthesia: Moderate Sedation | Laterality: Left

## 2015-05-31 MED ORDER — HYDRALAZINE HCL 20 MG/ML IJ SOLN
INTRAMUSCULAR | Status: AC
  Administered 2015-05-31: 10 mg via INTRAVENOUS
  Filled 2015-05-31: qty 1

## 2015-05-31 MED ORDER — DEXTROSE 5 % IV SOLN
INTRAVENOUS | Status: AC
  Filled 2015-05-31 (×23): qty 1.5

## 2015-05-31 MED ORDER — HYDROMORPHONE HCL 1 MG/ML IJ SOLN: 1.0000 mg | Freq: Once | INTRAMUSCULAR | Status: DC

## 2015-05-31 MED ORDER — FENTANYL CITRATE (PF) 100 MCG/2ML IJ SOLN
INTRAMUSCULAR | Status: AC
  Filled 2015-05-31: qty 2

## 2015-05-31 MED ORDER — HEPARIN SODIUM (PORCINE) 1000 UNIT/ML IJ SOLN
INTRAMUSCULAR | Status: AC
  Filled 2015-05-31: qty 1

## 2015-05-31 MED ORDER — HYDRALAZINE HCL 20 MG/ML IJ SOLN: 5.0000 mg | INTRAMUSCULAR | Status: DC | PRN

## 2015-05-31 MED ORDER — ACETAMINOPHEN 325 MG RE SUPP: 325.0000 mg | RECTAL | Status: DC | PRN

## 2015-05-31 MED ORDER — MIDAZOLAM HCL 5 MG/5ML IJ SOLN
INTRAMUSCULAR | Status: AC
  Filled 2015-05-31: qty 5

## 2015-05-31 MED ORDER — OXYCODONE-ACETAMINOPHEN 5-325 MG PO TABS: 1.0000 | ORAL_TABLET | ORAL | Status: DC | PRN

## 2015-05-31 MED ORDER — FAMOTIDINE 20 MG PO TABS: 40.0000 mg | ORAL_TABLET | ORAL | Status: DC | PRN

## 2015-05-31 MED ORDER — PHENOL 1.4 % MT LIQD: 1.0000 | OROMUCOSAL | Status: DC | PRN

## 2015-05-31 MED ORDER — HYDROMORPHONE HCL 1 MG/ML IJ SOLN: 0.5000 mg | INTRAMUSCULAR | Status: DC | PRN

## 2015-05-31 MED ORDER — FENTANYL CITRATE (PF) 100 MCG/2ML IJ SOLN
INTRAMUSCULAR | Status: DC | PRN
  Administered 2015-05-31 (×3): 50 ug via INTRAVENOUS

## 2015-05-31 MED ORDER — HEPARIN (PORCINE) IN NACL 2-0.9 UNIT/ML-% IJ SOLN
INTRAMUSCULAR | Status: AC
Start: 2015-05-31 — End: 2015-05-31
  Filled 2015-05-31: qty 1000

## 2015-05-31 MED ORDER — SODIUM CHLORIDE 0.9 % IV SOLN
INTRAVENOUS | Status: DC
  Administered 2015-05-31: 11:00:00 via INTRAVENOUS

## 2015-05-31 MED ORDER — LIDOCAINE-EPINEPHRINE (PF) 1 %-1:200000 IJ SOLN
INTRAMUSCULAR | Status: AC
  Filled 2015-05-31: qty 30

## 2015-05-31 MED ORDER — ONDANSETRON HCL 4 MG/2ML IJ SOLN: 4.0000 mg | Freq: Four times a day (QID) | INTRAMUSCULAR | Status: DC | PRN

## 2015-05-31 MED ORDER — METOPROLOL TARTRATE 5 MG/5ML IV SOLN: 2.0000 mg | INTRAVENOUS | Status: DC | PRN

## 2015-05-31 MED ORDER — CLOPIDOGREL BISULFATE 75 MG PO TABS
75.0000 mg | ORAL_TABLET | Freq: Once | ORAL | Status: AC
  Administered 2015-05-31: 75 mg via ORAL

## 2015-05-31 MED ORDER — GUAIFENESIN-DM 100-10 MG/5ML PO SYRP: 15.0000 mL | ORAL_SOLUTION | ORAL | Status: DC | PRN

## 2015-05-31 MED ORDER — LIDOCAINE-EPINEPHRINE (PF) 1 %-1:200000 IJ SOLN
INTRAMUSCULAR | Status: DC | PRN
  Administered 2015-05-31: 10 mL via INTRADERMAL

## 2015-05-31 MED ORDER — DEXTROSE 5 % IV SOLN
1.5000 g | INTRAVENOUS | Status: AC
  Administered 2015-05-31: 1.5 g via INTRAVENOUS

## 2015-05-31 MED ORDER — MIDAZOLAM HCL 2 MG/2ML IJ SOLN
INTRAMUSCULAR | Status: DC | PRN
  Administered 2015-05-31 (×2): 2 mg via INTRAVENOUS
  Administered 2015-05-31: 1 mg via INTRAVENOUS

## 2015-05-31 MED ORDER — LABETALOL HCL 5 MG/ML IV SOLN: 10.0000 mg | INTRAVENOUS | Status: DC | PRN

## 2015-05-31 MED ORDER — METHYLPREDNISOLONE SODIUM SUCC 125 MG IJ SOLR: 125.0000 mg | INTRAMUSCULAR | Status: DC | PRN

## 2015-05-31 MED ORDER — ONDANSETRON HCL 4 MG/2ML IJ SOLN
4.0000 mg | Freq: Four times a day (QID) | INTRAMUSCULAR | Status: DC | PRN
Start: 2015-05-31 — End: 2015-05-31

## 2015-05-31 MED ORDER — IOPAMIDOL (ISOVUE-300) INJECTION 61%
INTRAVENOUS | Status: DC | PRN
  Administered 2015-05-31: 55 mg via INTRA_ARTERIAL

## 2015-05-31 MED ORDER — HEPARIN SODIUM (PORCINE) 1000 UNIT/ML IJ SOLN
INTRAMUSCULAR | Status: DC | PRN
  Administered 2015-05-31: 2000 [IU] via INTRAVENOUS
  Administered 2015-05-31: 3000 [IU] via INTRAVENOUS

## 2015-05-31 MED ORDER — HYDRALAZINE HCL 20 MG/ML IJ SOLN
10.0000 mg | Freq: Once | INTRAMUSCULAR | Status: AC
  Administered 2015-05-31: 10 mg via INTRAVENOUS

## 2015-05-31 MED ORDER — ACETAMINOPHEN 325 MG PO TABS: 325.0000 mg | ORAL_TABLET | ORAL | Status: DC | PRN

## 2015-05-31 MED ORDER — CLOPIDOGREL BISULFATE 75 MG PO TABS: 75.0000 mg | ORAL_TABLET | Freq: Every day | ORAL | Status: AC

## 2015-05-31 MED ORDER — SODIUM CHLORIDE 0.9 % IV SOLN
500.0000 mL | Freq: Once | INTRAVENOUS | Status: DC | PRN
Start: 2015-05-31 — End: 2015-05-31

## 2015-05-31 MED ORDER — CLOPIDOGREL BISULFATE 75 MG PO TABS
ORAL_TABLET | ORAL | Status: AC
  Administered 2015-05-31: 75 mg via ORAL
  Filled 2015-05-31: qty 1

## 2015-05-31 SURGICAL SUPPLY — 27 items
BALLN LUTONIX DCB 7X60X130 (BALLOONS) ×3
BALLN ULTRVRSE 4X300X150 (BALLOONS) ×3
BALLN ULTRVRSE 6X150X130 (BALLOONS) ×3
BALLN ULTRVRSE 6X80X130 (BALLOONS) ×3
BALLOON LUTONIX DCB 7X60X130 (BALLOONS) IMPLANT
BALLOON ULTRVRSE 4X300X150 (BALLOONS) IMPLANT
BALLOON ULTRVRSE 6X150X130 (BALLOONS) IMPLANT
BALLOON ULTRVRSE 6X80X130 (BALLOONS) IMPLANT
CATH ANGIO 5F 100CM .035 PIG (CATHETERS) ×2 IMPLANT
CATH CXI SUPP ANG 4FR 135 (MICROCATHETER) IMPLANT
CATH CXI SUPP ANG 4FR 135CM (MICROCATHETER) ×3
CATH PIG 70CM (CATHETERS) ×3 IMPLANT
CATH SEEKER .035X150CM (CATHETERS) ×2 IMPLANT
DEVICE PRESTO INFLATION (MISCELLANEOUS) ×2 IMPLANT
DEVICE RAD TR BAND REGULAR (VASCULAR PRODUCTS) ×4 IMPLANT
DEVICE TORQUE (MISCELLANEOUS) ×2 IMPLANT
GLIDESHEATH SLEND SS 6F .021 (SHEATH) ×2 IMPLANT
GLIDEWIRE ADV .035X260CM (WIRE) ×2 IMPLANT
LIFESTENT 7X100X130 (Permanent Stent) ×2 IMPLANT
PACK ANGIOGRAPHY (CUSTOM PROCEDURE TRAY) ×3 IMPLANT
SHEATH BRITE TIP 5FRX11 (SHEATH) ×3 IMPLANT
SYR MEDRAD MARK V 150ML (SYRINGE) ×3 IMPLANT
TOWEL OR 17X26 4PK STRL BLUE (TOWEL DISPOSABLE) ×2 IMPLANT
TUBING CONTRAST HIGH PRESS 72 (TUBING) ×3 IMPLANT
VALVE HEMO TOUHY BORST Y (VALVE) ×2 IMPLANT
WIRE G V18X300CM (WIRE) ×2 IMPLANT
WIRE J 3MM .035X145CM (WIRE) ×3 IMPLANT

## 2015-05-31 NOTE — Discharge Instructions (Signed)
Angiogram, Care After °Refer to this sheet in the next few weeks. These instructions provide you with information about caring for yourself after your procedure. Your health care provider may also give you more specific instructions. Your treatment has been planned according to current medical practices, but problems sometimes occur. Call your health care provider if you have any problems or questions after your procedure. °WHAT TO EXPECT AFTER THE PROCEDURE °After your procedure, it is typical to have the following: °· Bruising at the catheter insertion site that usually fades within 1-2 weeks. °· Blood collecting in the tissue (hematoma) that may be painful to the touch. It should usually decrease in size and tenderness within 1-2 weeks. °HOME CARE INSTRUCTIONS °· Take medicines only as directed by your health care provider. °· You may shower 24-48 hours after the procedure or as directed by your health care provider. Remove the bandage (dressing) and gently wash the site with plain soap and water. Pat the area dry with a clean towel. Do not rub the site, because this may cause bleeding. °· Do not take baths, swim, or use a hot tub until your health care provider approves. °· Check your insertion site every day for redness, swelling, or drainage. °· Do not apply powder or lotion to the site. °· Do not lift over 10 lb (4.5 kg) for 5 days after your procedure or as directed by your health care provider. °· Ask your health care provider when it is okay to: °¨ Return to work or school. °¨ Resume usual physical activities or sports. °¨ Resume sexual activity. °· Do not drive home if you are discharged the same day as the procedure. Have someone else drive you. °· You may drive 24 hours after the procedure unless otherwise instructed by your health care provider. °· Do not operate machinery or power tools for 24 hours after the procedure or as directed by your health care provider. °· If your procedure was done as an  outpatient procedure, which means that you went home the same day as your procedure, a responsible adult should be with you for the first 24 hours after you arrive home. °· Keep all follow-up visits as directed by your health care provider. This is important. °SEEK MEDICAL CARE IF: °· You have a fever. °· You have chills. °· You have increased bleeding from the catheter insertion site. Hold pressure on the site. °SEEK IMMEDIATE MEDICAL CARE IF: °· You have unusual pain at the catheter insertion site. °· You have redness, warmth, or swelling at the catheter insertion site. °· You have drainage (other than a small amount of blood on the dressing) from the catheter insertion site. °· The catheter insertion site is bleeding, and the bleeding does not stop after 30 minutes of holding steady pressure on the site. °· The area near or just beyond the catheter insertion site becomes pale, cool, tingly, or numb. °  °This information is not intended to replace advice given to you by your health care provider. Make sure you discuss any questions you have with your health care provider. °  °Document Released: 07/10/2004 Document Revised: 01/12/2014 Document Reviewed: 05/25/2012 °Elsevier Interactive Patient Education ©2016 [employer] ° °

## 2015-05-31 NOTE — Op Note (Signed)
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 Ultrasound guidance for vascular access left radial artery 2. Catheter placement into left distal superficial femoral artery from left radial approach 3. Aortogram and selective left lower extremity angiogram 4. Percutaneous transluminal angioplasty of left superficial femoral artery with 4 mm diameter by 30 cm length angioplasty balloon 5. Percutaneous transluminal angioplasty of left external iliac artery with 6 mm diameter conventional and 7 mm diameter drug-coated angioplasty balloon  6.  Self-expanding stent placement to the left external iliac artery with 7 mm diameter by 10 cm length life stent   EBL: 25 cc  Contrast: 55 cc  Fluoro Time: 8.5 minutes  Moderate Conscious Sedation Time: approximately 80 minutes using 5 mg of Versed and 150 mcg of Fentanyl  Indications: Patient is a 65 y.o.male with a gangrenous left great toe. The patient has noninvasive study showing markedly reduced ABIs bilaterally. He is already status post femoral-femoral bypass or right iliac occlusion. A CT scan was done to help with preprocedural planning, and diffuse disease was noted. The patient is brought in for angiography for further evaluation and potential treatment. Risks and benefits are discussed and informed consent is obtained  Procedure: The patient was identified and appropriate procedural time out was performed. The patient was then placed supine on the table and prepped and draped in the usual sterile fashion.Moderate conscious sedation was administered during a face to face encounter with the patient throughout the procedure with  my supervision of the RN administering medicines and monitoring the patient's vital signs, pulse oximetry, telemetry and mental status throughout from the start of the procedure until the patient was taken to the recovery room. Ultrasound was used to evaluate the left radial artery. It was patent . A digital ultrasound image was acquired. A Seldinger needle was used to access the left radial artery under direct ultrasound guidance and a permanent image was performed. A wire was advanced without resistance and a 6Fr skinny sheath was placed. Pigtail catheter was placed into the aorta and an AP aortogram was performed. This demonstrated normal renal arteries. Aorta calcific but patent. Right iliac artery completely occluded. Left common iliac artery was patent but external iliac artery had about an 80% stenosis above his femoral to femoral bypass which was patent. I then imaged the left leg. The SFA had multiple areas of greater than 70% stenosis throughout much of its course. The popliteal artery at just above the knee had a short segment occlusion. He then had a good posterior tibial artery as runoff distally with a small peroneal artery. The patient was systemically heparinized and a total of 5000 units of heparin were then given. I used an advantage wire and a CXI catheter to navigate through the iliac artery stenosis and into the superficial femoral artery. I then exchanged for a 300 cm V 18 wire. I was able to cross all the SFA stenosis and get down to the popliteal artery occlusion. The longest catheter available which is 150 cm were get to the distal superficial femoral artery but not cross the popliteal occlusion. It was important to treat his inflow and felt that treating his SFA disease and also be helpful. An approaches popliteal occlusion from a pedal access or a femoral access in the future. I treated the SFA throughout much of its course with a  4 mm diameter by 30 cm length angioplasty balloon.  This was inflated to 10 atm 1 minute. Completion angiogram showed no greater than 30% residual stenosis in the SFA at this point. I then turned my attention to the iliac artery lesion. A 7 mm diameter by 6 cm length Lutonix drug-coated angioplasty balloon was inflated to 8 atm 1 minute. Following angioplasty there was a dissection in a high-grade residual stenosis. I used a 6 mm diameter by 8 cm length angioplasty balloon and tried to hold this up for a couple of minutes in the left external iliac artery on the left. This was inflated to 6 atm. Following this, there remained a high-grade residual stenosis. I elected to treat this area with a stent. A 7 mm diameter by 10 cm length stent was deployed in the external iliac artery to the top of the femoral head. This was postdilated with a 6 mm balloon. Completion and gram showed brisk flow and no apparent stenosis of greater than 25% at this point. With the popliteal occlusion unable to be treated from this left arm approach, I felt we had improved his flow was much as possible today. A TR band was placed in the left radial sheath was removed. The patient was taken to the recovery room in stable condition having tolerated the procedure well.  Findings:  Aortogram: Normal renal arteries. Aorta calcific but patent. Right iliac artery completely occluded. Left common iliac artery was patent but external iliac artery had about an 80% stenosis above his femoral to femoral bypass which was patent. Left Lower Extremity:  The SFA had multiple areas of greater than 70% stenosis throughout much of its course. The popliteal artery at just above the knee had a short segment occlusion. He then had a good posterior tibial artery as runoff distally with a small peroneal artery.    Disposition: Patient was taken to the recovery room in stable condition having tolerated the procedure well.  Complications: None  Donna Silverman 05/31/2015 2:12 PM

## 2015-05-31 NOTE — H&P (Signed)
  [facility] History & Physical Update  The patient was interviewed and re-examined.  The patient's previous History and Physical has been reviewed and is unchanged.  There is no change in the plan of care. We plan to proceed with the scheduled procedure.  DEW,JASON, MD  05/31/2015, 11:38 AM

## 2015-06-04 ENCOUNTER — Encounter: Payer: Self-pay | Admitting: Vascular Surgery

## 2015-07-01 ENCOUNTER — Other Ambulatory Visit: Payer: Self-pay | Admitting: Vascular Surgery

## 2015-07-01 DIAGNOSIS — R29898 Other symptoms and signs involving the musculoskeletal system: Secondary | ICD-10-CM

## 2015-07-10 ENCOUNTER — Ambulatory Visit
Admission: RE | Admit: 2015-07-10 | Discharge: 2015-07-10 | Disposition: A | Discharge status: Home / Self Care | Payer: Medicare Other | Source: Ambulatory Visit | Attending: Vascular Surgery | Admitting: Vascular Surgery

## 2015-07-10 DIAGNOSIS — R29898 Other symptoms and signs involving the musculoskeletal system: Secondary | ICD-10-CM

## 2015-07-10 DIAGNOSIS — M6289 Other specified disorders of muscle: Secondary | ICD-10-CM | POA: Diagnosis present

## 2015-07-10 DIAGNOSIS — I6502 Occlusion and stenosis of left vertebral artery: Secondary | ICD-10-CM | POA: Diagnosis not present

## 2015-07-10 DIAGNOSIS — I671 Cerebral aneurysm, nonruptured: Secondary | ICD-10-CM | POA: Diagnosis not present

## 2015-07-10 DIAGNOSIS — I6523 Occlusion and stenosis of bilateral carotid arteries: Secondary | ICD-10-CM | POA: Insufficient documentation

## 2015-07-10 LAB — POCT I-STAT CREATININE: Creatinine, Ser: 0.9 mg/dL (ref 0.61–1.24)

## 2015-07-10 MED ORDER — IOPAMIDOL (ISOVUE-370) INJECTION 76%
75.0000 mL | Freq: Once | INTRAVENOUS | Status: AC | PRN
  Administered 2015-07-10: 75 mL via INTRAVENOUS

## 2015-10-29 ENCOUNTER — Other Ambulatory Visit (INDEPENDENT_AMBULATORY_CARE_PROVIDER_SITE_OTHER): Payer: Self-pay | Admitting: Vascular Surgery

## 2015-11-06 ENCOUNTER — Other Ambulatory Visit (INDEPENDENT_AMBULATORY_CARE_PROVIDER_SITE_OTHER): Payer: Self-pay | Admitting: Vascular Surgery

## 2015-11-06 ENCOUNTER — Encounter (INDEPENDENT_AMBULATORY_CARE_PROVIDER_SITE_OTHER): Payer: Self-pay

## 2015-11-06 ENCOUNTER — Encounter (INDEPENDENT_AMBULATORY_CARE_PROVIDER_SITE_OTHER): Payer: Medicare Other

## 2015-11-06 ENCOUNTER — Ambulatory Visit (INDEPENDENT_AMBULATORY_CARE_PROVIDER_SITE_OTHER): Payer: Self-pay | Admitting: Vascular Surgery

## 2015-11-06 DIAGNOSIS — I739 Peripheral vascular disease, unspecified: Secondary | ICD-10-CM

## 2015-11-06 DIAGNOSIS — I70213 Atherosclerosis of native arteries of extremities with intermittent claudication, bilateral legs: Secondary | ICD-10-CM

## 2016-10-09 ENCOUNTER — Encounter: Payer: Self-pay | Admitting: *Deleted

## 2016-10-09 ENCOUNTER — Emergency Department: Payer: Medicare Other

## 2016-10-09 ENCOUNTER — Emergency Department
Admission: EM | Admit: 2016-10-09 | Discharge: 2016-10-09 | Disposition: A | Discharge status: Home / Self Care | Payer: Medicare Other | Attending: Emergency Medicine | Admitting: Emergency Medicine

## 2016-10-09 DIAGNOSIS — F1020 Alcohol dependence, uncomplicated: Secondary | ICD-10-CM | POA: Insufficient documentation

## 2016-10-09 DIAGNOSIS — J449 Chronic obstructive pulmonary disease, unspecified: Secondary | ICD-10-CM | POA: Insufficient documentation

## 2016-10-09 DIAGNOSIS — Y906 Blood alcohol level of 120-199 mg/100 ml: Secondary | ICD-10-CM | POA: Insufficient documentation

## 2016-10-09 DIAGNOSIS — I10 Essential (primary) hypertension: Secondary | ICD-10-CM | POA: Diagnosis not present

## 2016-10-09 DIAGNOSIS — R202 Paresthesia of skin: Secondary | ICD-10-CM | POA: Insufficient documentation

## 2016-10-09 DIAGNOSIS — R200 Anesthesia of skin: Secondary | ICD-10-CM | POA: Diagnosis present

## 2016-10-09 DIAGNOSIS — I2510 Atherosclerotic heart disease of native coronary artery without angina pectoris: Secondary | ICD-10-CM | POA: Insufficient documentation

## 2016-10-09 DIAGNOSIS — R609 Edema, unspecified: Secondary | ICD-10-CM

## 2016-10-09 DIAGNOSIS — F17210 Nicotine dependence, cigarettes, uncomplicated: Secondary | ICD-10-CM | POA: Insufficient documentation

## 2016-10-09 LAB — COMPREHENSIVE METABOLIC PANEL
ALT: 14 U/L — ABNORMAL LOW (ref 17–63)
AST: 29 U/L (ref 15–41)
Albumin: 3.9 g/dL (ref 3.5–5.0)
Alkaline Phosphatase: 52 U/L (ref 38–126)
Anion gap: 13 (ref 5–15)
BUN: 6 mg/dL (ref 6–20)
CO2: 23 mmol/L (ref 22–32)
Calcium: 9.1 mg/dL (ref 8.9–10.3)
Chloride: 105 mmol/L (ref 101–111)
Creatinine, Ser: 0.7 mg/dL (ref 0.61–1.24)
Glucose, Bld: 89 mg/dL (ref 65–99)
POTASSIUM: 3.3 mmol/L — AB (ref 3.5–5.1)
Sodium: 141 mmol/L (ref 135–145)
Total Bilirubin: 0.5 mg/dL (ref 0.3–1.2)
Total Protein: 7.3 g/dL (ref 6.5–8.1)

## 2016-10-09 LAB — CBC WITH DIFFERENTIAL/PLATELET
BASOS ABS: 0.1 10*3/uL (ref 0–0.1)
Basophils Relative: 1 %
Eosinophils Absolute: 0.1 10*3/uL (ref 0–0.7)
Eosinophils Relative: 3 %
HCT: 45.2 % (ref 40.0–52.0)
Hemoglobin: 16 g/dL (ref 13.0–18.0)
Lymphocytes Relative: 47 %
Lymphs Abs: 2.2 10*3/uL (ref 1.0–3.6)
MCH: 36.8 pg — ABNORMAL HIGH (ref 26.0–34.0)
MCHC: 35.3 g/dL (ref 32.0–36.0)
MCV: 104.2 fL — AB (ref 80.0–100.0)
MONO ABS: 0.5 10*3/uL (ref 0.2–1.0)
Monocytes Relative: 11 %
Neutro Abs: 1.8 10*3/uL (ref 1.4–6.5)
Neutrophils Relative %: 38 %
Platelets: 146 10*3/uL — ABNORMAL LOW (ref 150–440)
RBC: 4.34 MIL/uL — ABNORMAL LOW (ref 4.40–5.90)
RDW: 13.8 % (ref 11.5–14.5)
WBC: 4.7 10*3/uL (ref 3.8–10.6)

## 2016-10-09 LAB — ETHANOL: ALCOHOL ETHYL (B): 177 mg/dL — AB (ref <10)

## 2016-10-09 LAB — TROPONIN I: Troponin I: <0.03 ng/mL (ref <0.03)

## 2016-10-09 MED ORDER — AMLODIPINE BESYLATE 10 MG PO TABS: 10.0000 mg | ORAL_TABLET | Freq: Every day | ORAL | 2 refills | Status: AC

## 2016-10-09 MED ORDER — ASPIRIN 81 MG PO TBEC: 81.0000 mg | DELAYED_RELEASE_TABLET | Freq: Every day | ORAL | 12 refills | Status: AC

## 2016-10-09 MED ORDER — CLOPIDOGREL BISULFATE 75 MG PO TABS: 75.0000 mg | ORAL_TABLET | Freq: Every day | ORAL | 11 refills | Status: AC

## 2016-10-09 MED ORDER — SPIRONOLACTONE 50 MG PO TABS: 50.0000 mg | ORAL_TABLET | Freq: Every day | ORAL | 11 refills | Status: AC

## 2016-10-09 MED ORDER — THIAMINE HCL 100 MG/ML IJ SOLN
200.0000 mg | Freq: Once | INTRAMUSCULAR | Status: AC
  Administered 2016-10-09: 200 mg via INTRAVENOUS
  Filled 2016-10-09: qty 2

## 2016-10-09 MED ORDER — VITAMIN B-1 100 MG PO TABS: 100.0000 mg | ORAL_TABLET | Freq: Every day | ORAL | 2 refills | Status: AC

## 2016-10-09 NOTE — ED Triage Notes (Signed)
First Nurse Note:  Arrives C/O lower extremity swelling and tingling to bilateral arms since Monday.  Patient is AAOx3.  Skin warm and dry. NAD

## 2016-10-09 NOTE — ED Notes (Signed)

## 2016-10-09 NOTE — ED Triage Notes (Signed)
Pt complains of generalized weakness /numbness to extremities since Monday, pt has bilateral swelling to feet, pt reports swelling has been ongoing for months, pt drinks a 18 pack of beer daily, pt has taken himself all prescribed medications

## 2016-10-09 NOTE — ED Provider Notes (Signed)
[facility] Emergency Department Provider Note       Time seen: ----------------------------------------- 7:23 PM on 10/09/2016 -----------------------------------------     I have reviewed the triage vital signs and the nursing notes.   HISTORY   Chief Complaint Numbness    HPI Ralph [name] is a 66 y.o. male who presents to the ED for weakness and numbness to the extremity since Monday. Patient has bilateral swelling to his feet and reports the swelling has been ongoing for several months. Patient states he drinks 18 pack of beer daily and he has taken himself off his prescribed medicines so months ago because he didn't feel like they're helping. Patient has had weakness and difficulty walking for some time.  Past Medical History:  Diagnosis Date  . Abdominal bruit   . Cerebral vascular disease    With less than 50% bilateral internal carotid artery stenosis by recent carotid Dopplers  . COPD (chronic obstructive pulmonary disease) (HCC)   . Coronary artery disease    S/p cardiac catheterization 3/11 moderate diffuse 3-vessel CAD with normal LV function  . Dyslipidemia    Untreated  . GERD (gastroesophageal reflux disease)   . History of alcohol abuse   . History of myocardial infarction    At age 36 and again at age 68  . History of tobacco abuse    Ongoing  . Hypertension    Uncontrolled  . Inguinal hernia, right   . Intermittent claudication (HCC)    Right leg; S/p right iliac occlusive disease s/p left to right femoral-femoral bypass surgery  . Palpitations   . Peripheral arterial disease (HCC)    With abnormal ABIs (0.58 on right, 0.93 on left)  . S/P PTCA (percutaneous transluminal coronary angioplasty)     Patient Active Problem List   Diagnosis Date Noted  . PVD (peripheral vascular disease) (HCC) 10/14/2011  . PVD WITH CLAUDICATION 02/26/2009  . NONSPECIFIC ABNORMAL UNSPEC CV FUNCTION STUDY 02/26/2009  . DYSLIPIDEMIA 09/25/2008   . HYPERTENSION 09/25/2008  . CORONARY ARTERY DISEASE, S/P PTCA 09/25/2008  . INTERMITTENT CLAUDICATION, RIGHT LEG 09/25/2008  . GASTROESOPHAGEAL REFLUX DISEASE 09/25/2008  . PALPITATIONS, HX OF 09/25/2008  . INGUINAL HERNIA, RIGHT, HX OF 09/25/2008  . TOBACCO ABUSE, HX OF 09/25/2008  . PERCUTANEOUS TRANSLUMINAL CORONARY ANGIOPLASTY, HX OF 09/25/2008    Past Surgical History:  Procedure Laterality Date  . CORONARY ANGIOPLASTY     x3  . FEMORAL-FEMORAL BYPASS GRAFT    . INGUINAL HERNIA REPAIR  2008   Right  . PERIPHERAL VASCULAR CATHETERIZATION Left 05/31/2015   Procedure: Lower Extremity Angiography;  Surgeon: Annice Needy, MD;  Location: [facility] INVASIVE CV LAB;  Service: Cardiovascular;  Laterality: Left;    Allergies Patient has no known allergies.  Social History Social History  Substance Use Topics  . Smoking status: Current Every Day Smoker    Packs/day: 1.00    Years: 50.00    Types: Cigarettes  . Smokeless tobacco: Never Used  . Alcohol use 6.0 oz/week    10 Cans of beer per week    Review of Systems Constitutional: Negative for fever. Eyes: Negative for vision changes, positive for eye drainage ENT:  Negative for congestion, sore throat Cardiovascular: Negative for chest pain. Respiratory: Negative for shortness of breath. Gastrointestinal: Negative for abdominal pain, vomiting and diarrhea. Genitourinary: Negative for dysuria. Musculoskeletal: positive for edema and leg weakness Skin: Negative for rash. Neurological: positive for upper extremity numbness  All systems negative/normal/unremarkable except as stated in  the HPI  ____________________________________________   PHYSICAL EXAM:  VITAL SIGNS: ED Triage Vitals [10/09/16 1746]  Enc Vitals Group     BP (!) 190/100     Pulse Rate 82     Resp 20     Temp 97.8 F (36.6 C)     Temp Source Oral     SpO2 98 %     Weight 155 lb (70.3 kg)     Height  (1.753 m)     Head Circumference      Peak  Flow      Pain Score      Pain Loc      Pain Edu?      Excl. in GC?     Constitutional: Alert and oriented. Well appearing and in no distress.patient smells heavily of alcohol Eyes: Conjunctivae are normal. Normal extraocular movements. ENT   Head: Normocephalic and atraumatic.   Nose: No congestion/rhinnorhea.   Mouth/Throat: Mucous membranes are moist.   Neck: No stridor. Cardiovascular: Normal rate, regular rhythm. No murmurs, rubs, or gallops. Respiratory: Normal respiratory effort without tachypnea nor retractions. Breath sounds are clear and equal bilaterally. No wheezes/rales/rhonchi. Gastrointestinal: Soft and nontender. Normal bowel sounds Musculoskeletal: bilateral pitting edema is noted. Neurologic:  Normal speech and language. No gross focal neurologic deficits are appreciated. strength, cranial nerves. Intact. He has paresthesias in both arms. Skin:  skin erythema is noted diffusely. Psychiatric: Mood and affect are normal. Speech and behavior are normal.  ____________________________________________  EKG: Interpreted by me.sinus rhythm with a rate of 71 bpm, normal PR interval, possible septal infarct age indeterminate, normal QT, normal axis.  ____________________________________________  ED COURSE:  Pertinent labs & imaging results that were available during my care of the patient were reviewed by me and considered in my medical decision making (see chart for details). Patient presents for weakness, numbness and chronic alcoholism, we will assess with labs and imaging as indicated. Clinical Course as of Oct 10 2134  Fri Oct 09, 2016  2046 ABIs are adequate for the patient  [JW]    Clinical Course User Index [JW] Emily Filbert, MD   Procedures ____________________________________________   LABS (pertinent positives/negatives)  Labs Reviewed  CBC WITH DIFFERENTIAL/PLATELET - Abnormal; Notable for the following:       Result Value   RBC  4.34 (*)    MCV 104.2 (*)    MCH 36.8 (*)    Platelets 146 (*)    All other components within normal limits  COMPREHENSIVE METABOLIC PANEL - Abnormal; Notable for the following:    Potassium 3.3 (*)    ALT 14 (*)    All other components within normal limits  ETHANOL - Abnormal; Notable for the following:    Alcohol, Ethyl (B) 177 (*)    All other components within normal limits  TROPONIN I  URINALYSIS, COMPLETE (UACMP) WITH MICROSCOPIC    RADIOLOGY Images were viewed by me  CT head IMPRESSION: 1. No evidence of acute intracranial abnormality. 2. Generalized cerebral volume loss. 3. 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 4. Bilateral paranasal sinusitis, chronic appearing. ____________________________________________  DIFFERENTIAL DIAGNOSIS   electrolyte abnormality,'s encephalopathy, dehydration, alcohol intoxication, CVA, subdural hematoma   FINAL ASSESSMENT AND PLAN  weakness, paresthesias, chronic alcoholism, peripheral edema   Plan: Patient's labs and imaging were dictated above. Patient had presented for weakness and complaints of leg pain as well as paresthesias. I will place him on spironolactone for presumably cirrhosis and cirrhotic related edema. I  will also advise compression stockings. His ABIs are reassuring in both legs. He also needs to be on a multivitamin and I'll start him back on his blood pressure medicine. He is stable for outpatient follow-up with his primary care doctor.   Emily Filbert, MD   Note: This note was generated in part or whole with voice recognition software. Voice recognition is usually quite accurate but there are transcription errors that can and very often do occur. I apologize for any typographical errors that were not detected and corrected.     Emily Filbert, MD 10/09/16 2137

## 2016-10-09 NOTE — ED Notes (Signed)
[place] recollected and sent to lab; lab called and notified.

## 2016-10-09 NOTE — ED Notes (Signed)
Pt went to CT

## 2017-11-05 DEATH — deceased

## 2018-06-10 IMAGING — CT CT HEAD W/O CM
4 series · 16 of 47 positions shown, 18 images · non-contrast
Comparison: 07/10/2015 CT angiogram of the head.

CLINICAL DATA: Ataxia for 4 days. CVA suspected. No reported
injury.

EXAM:
CT HEAD WITHOUT CONTRAST
TECHNIQUE: Contiguous axial images were obtained from the base of the skull
through the vertex without intravenous contrast.

[Series 2: head wo · axial · 0.42mm/px · z∈[-86,+24]mm · 7 of 30 slices shown, 9 images]
[im 4/30  brain]
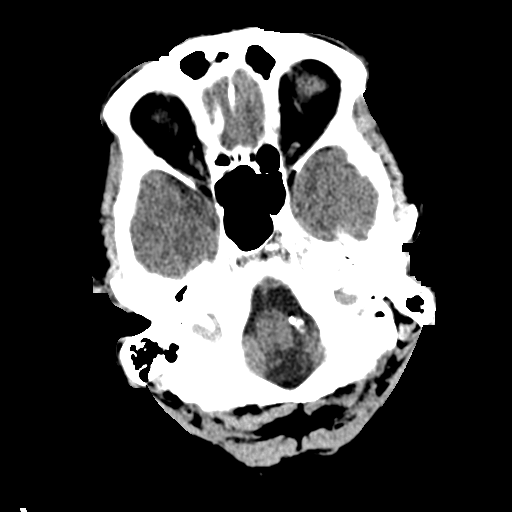
[im 4/30  bone]
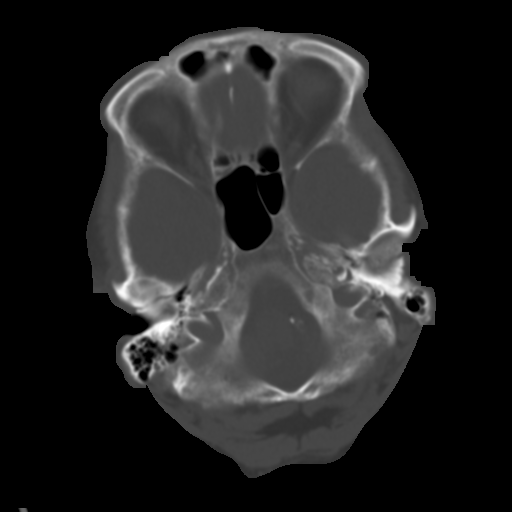
[im 8/30  brain]
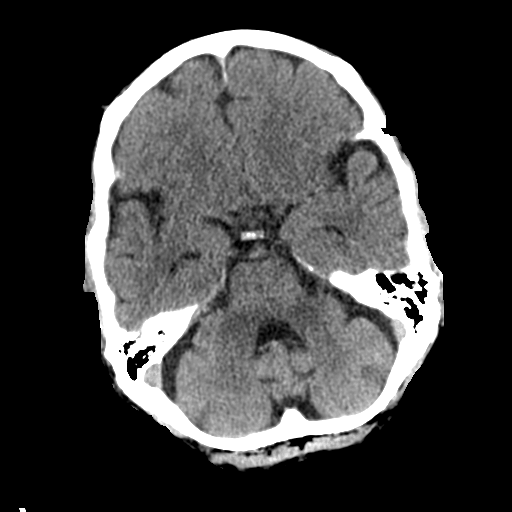
[im 11/30  brain]
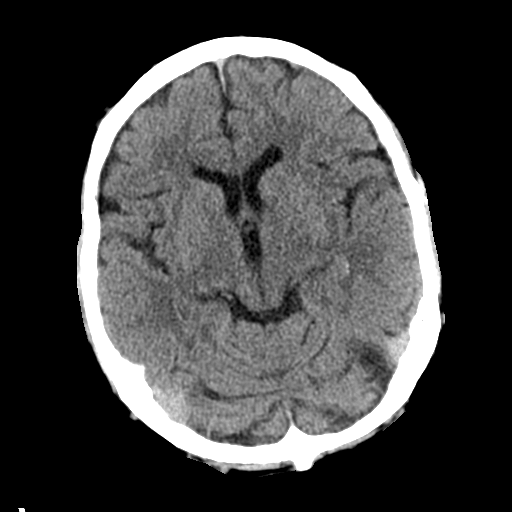
[im 15/30  brain]
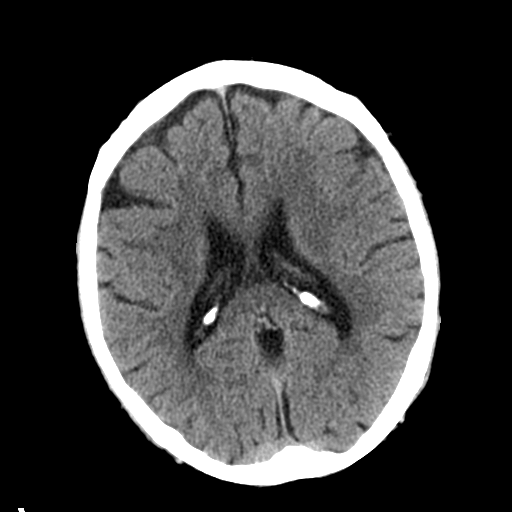
[im 19/30  brain]
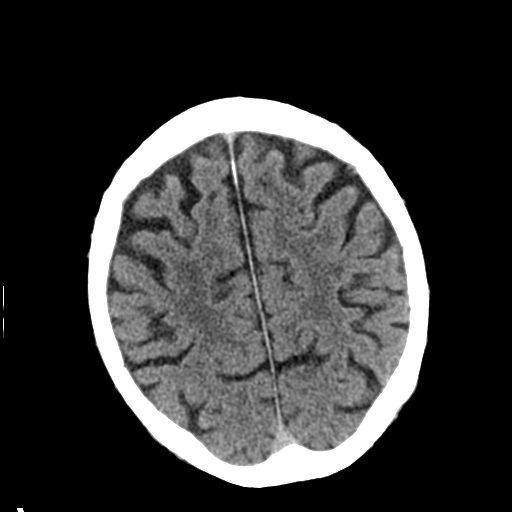
[im 19/30  bone]
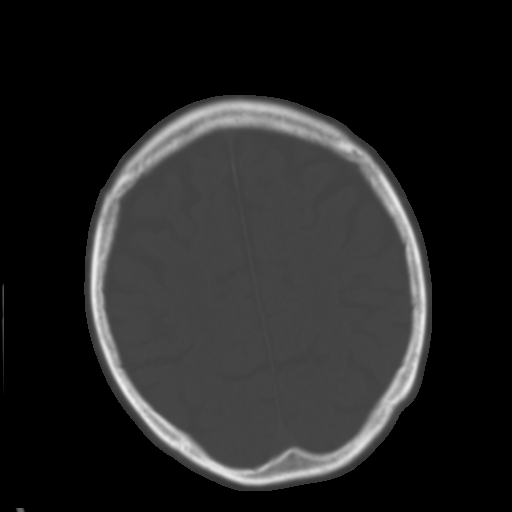
[im 22/30  brain]
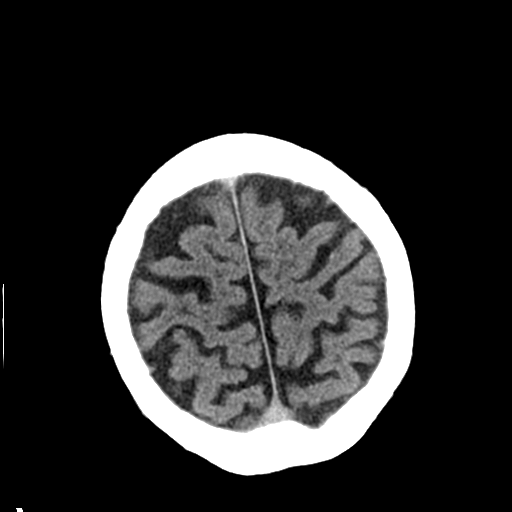
[im 26/30  brain]
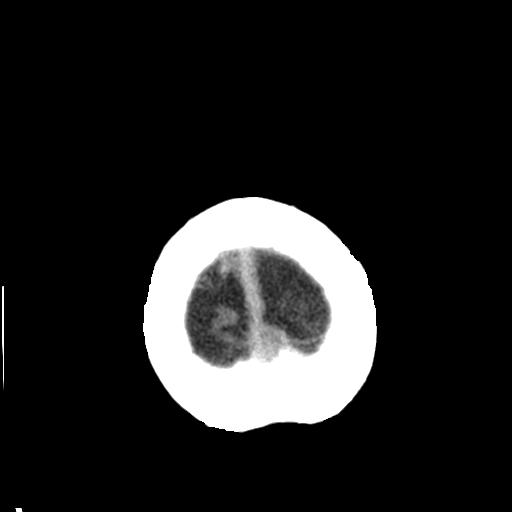

[Series 3: head bone · axial · 0.42mm/px · z∈[-87,-59]mm · 3 of 74 slices shown]
[im 8/74  bone]
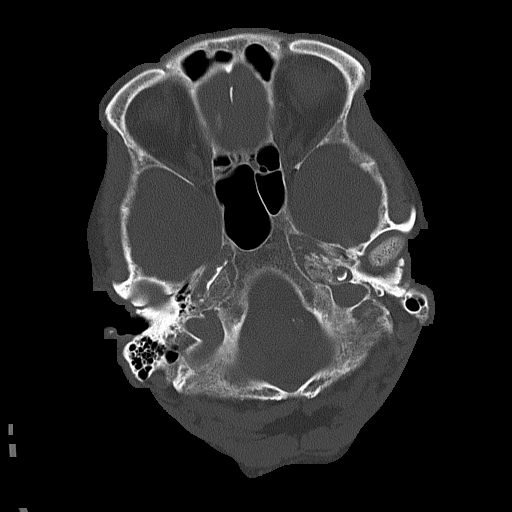
[im 15/74  bone]
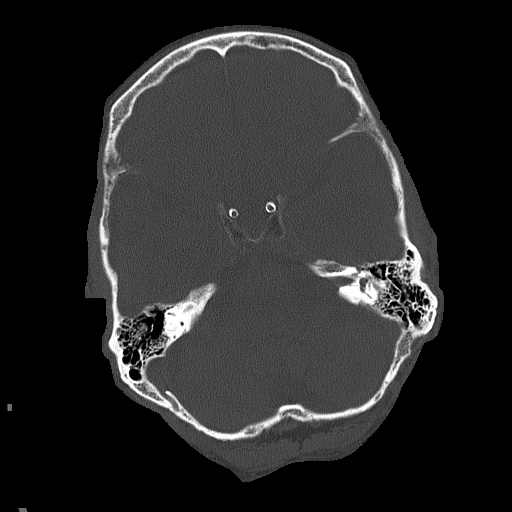
[im 22/74  bone]
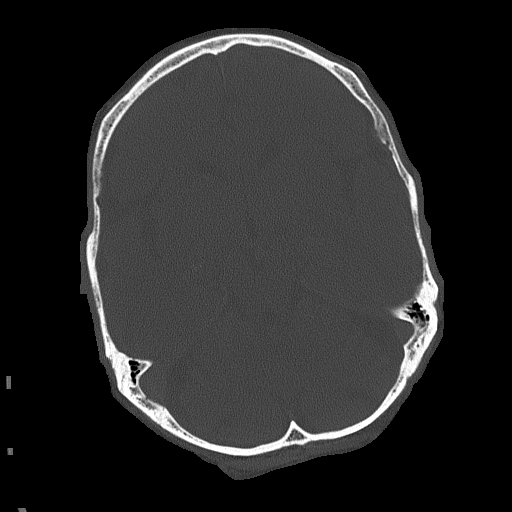

[Series 4: coronal soft tissue · coronal · 0.32mm/px · 3 of 65 slices shown]
[im 22/65  brain]
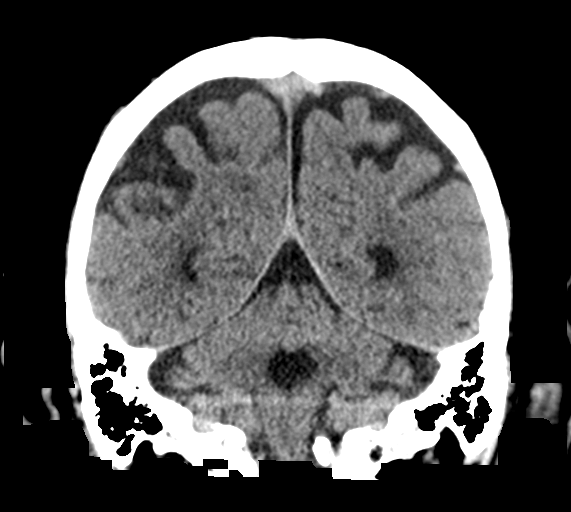
[im 29/65  brain]
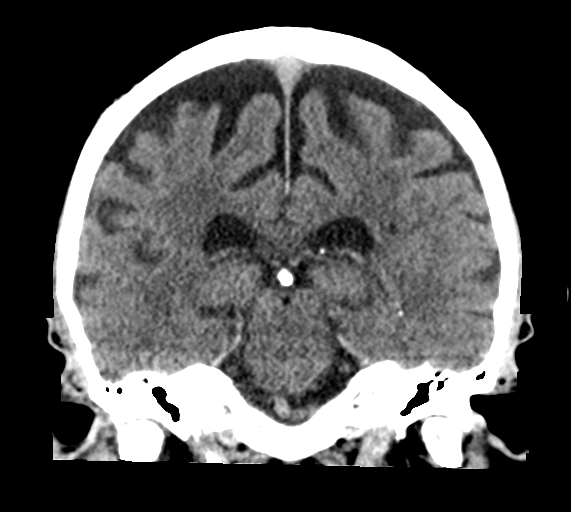
[im 36/65  brain]
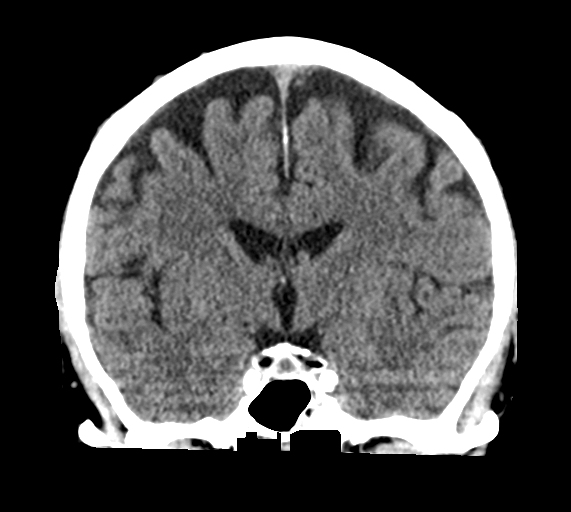

[Series 5: sagittal soft tissue · sagittal · 0.32mm/px · 3 of 54 slices shown]
[im 18/54  brain]
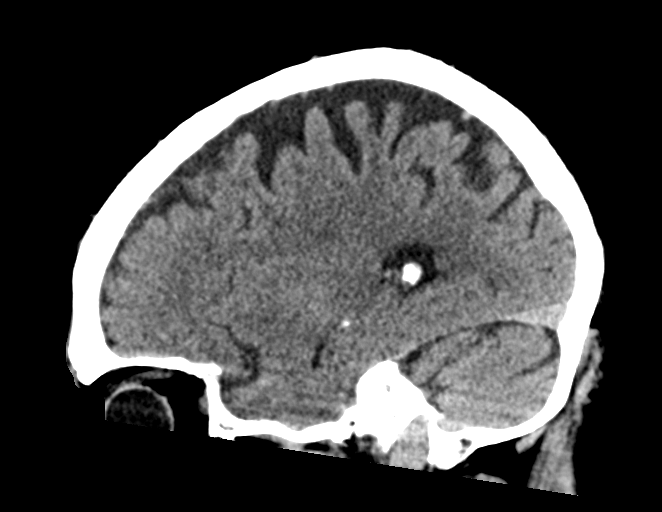
[im 27/54  brain]
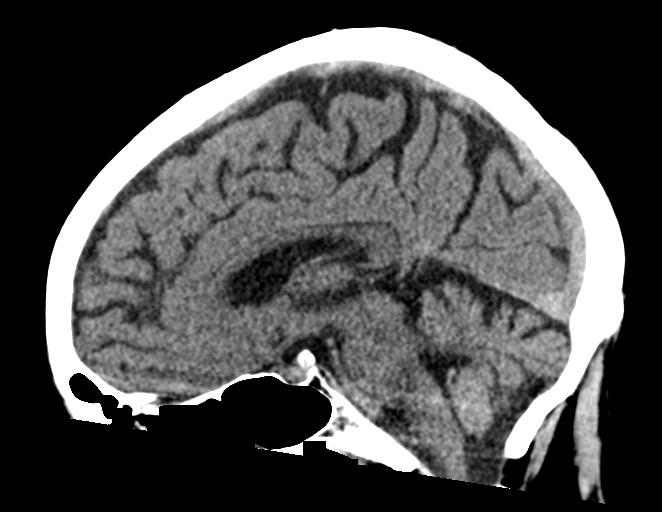
[im 36/54  brain]
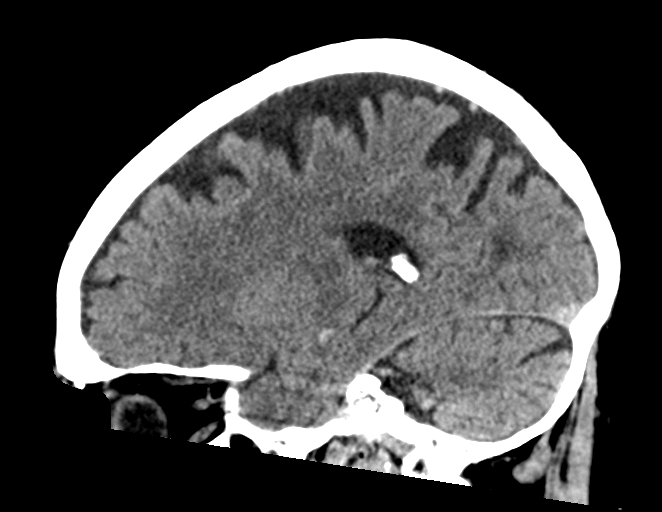

[16 of 47 positions shown; findings below may reference images not displayed]

FINDINGS: Brain: No evidence of parenchymal hemorrhage or extra-axial fluid
collection. No mass lesion, mass effect, or midline shift. No CT
evidence of acute infarction. Generalized cerebral volume loss. No
ventriculomegaly.

Vascular: No acute abnormality. Stable 6 mm aneurysm near the
basilar tip.

Skull: No evidence of calvarial fracture.

Sinuses/Orbits: No fluid levels. Mucoperiosteal thickening in the
bilateral ethmoidal air cells.

Other:  The mastoid air cells are unopacified.
IMPRESSION: 1.  No evidence of acute intracranial abnormality.
2. Generalized cerebral volume loss.
3. Stable 6 mm aneurysm near the basilar tip.
4. Bilateral paranasal sinusitis, chronic appearing.
